# Patient Record
Sex: Male | Born: 1955 | Race: Black or African American | Hispanic: No | Marital: Married | Smoking: Never smoker
Health system: Southern US, Community
[De-identification: ages and names within clinical notes are randomized; demographics above are authoritative.]

## PROBLEM LIST (undated history)

## (undated) DIAGNOSIS — C61 Malignant neoplasm of prostate: Secondary | ICD-10-CM

## (undated) DIAGNOSIS — I10 Essential (primary) hypertension: Secondary | ICD-10-CM

## (undated) HISTORY — PX: XI ROBOTIC ASSISTED INGUINAL HERNIA REPAIR WITH MESH: SHX6706

## (undated) HISTORY — PX: WISDOM TOOTH EXTRACTION: SHX21

## (undated) HISTORY — PX: PROSTATE BIOPSY: SHX241

---

## 2012-04-01 HISTORY — PX: XI ROBOTIC ASSISTED INGUINAL HERNIA REPAIR WITH MESH: SHX6706

## 2019-04-02 DIAGNOSIS — C61 Malignant neoplasm of prostate: Secondary | ICD-10-CM

## 2019-04-02 HISTORY — DX: Malignant neoplasm of prostate: C61

## 2019-11-02 HISTORY — PX: PROSTATE BIOPSY: SHX241

## 2019-12-01 ENCOUNTER — Encounter: Payer: Self-pay | Admitting: Radiation Oncology

## 2019-12-01 NOTE — Progress Notes (Signed)
GU Location of Tumor / Histology: prostatic adenocarcinoma  If Prostate Cancer, Gleason Score is (3 + 3) and PSA is (19.60). Prostate volume: 119 grams.   Loma Sender presented as a referral from Dr. Kristie Cowman to Dr. Lovena Neighbours in June for further evaluation of an elevated PSA.   Biopsies of prostate (if applicable) revealed:   Past/Anticipated interventions by urology, if any: prostate biopsy, referral to Dr. Tammi Klippel to discuss radiotherapy options.  Past/Anticipated interventions by medical oncology, if any: no  Weight changes, if any: no  Bowel/Bladder complaints, if any: IPSS 5. SHIM 24. Endorses taking tamsulosin. Reports nocturia x 0-2. Reports occasional urgency and frequency. Denies dysuria or hematuria. Denies urinary leakage or incontinence. Denies any bowel complaints.     Nausea/Vomiting, if any: no  Pain issues, if any:  denies  SAFETY ISSUES:  Prior radiation? denies  Pacemaker/ICD? denies  Possible current pregnancy? no, male patient  Is the patient on methotrexate? denies  Current Complaints / other details:  64 year old male. Married with two daughters and one son. Resides in [place]. Not working currently. Denies a family history of cancer.  Leaning toward active surveillance thus Dr. Lovena Neighbours has scheduled the patient for an MRI fusion biopsy in six months.

## 2019-12-02 ENCOUNTER — Telehealth: Payer: Self-pay | Admitting: Radiation Oncology

## 2019-12-02 NOTE — Telephone Encounter (Signed)
Received voicemail message from patient requesting return call. Phoned patient to inquire. Patient questions if consult appointment tomorrow can be moved down to later in the day. Apologized and explained that Dr. Tammi Klippel is already scheduled to see other patients later in the day. Offered to connect patient to scheduling department to reschedule. Patient states, "I will just keep the current appointment as is." Patient denies additional needs.

## 2019-12-03 ENCOUNTER — Ambulatory Visit
Admission: RE | Admit: 2019-12-03 | Discharge: 2019-12-03 | Disposition: A | Discharge status: Home / Self Care | Payer: BC Managed Care – PPO | Source: Ambulatory Visit | Attending: Radiation Oncology | Admitting: Radiation Oncology

## 2019-12-03 ENCOUNTER — Encounter: Payer: Self-pay | Admitting: Radiation Oncology

## 2019-12-03 ENCOUNTER — Other Ambulatory Visit: Payer: Self-pay

## 2019-12-03 VITALS — BP 131/91 | HR 70 | Temp 98.3°F | Resp 20 | Ht 69.0 in | Wt 184.4 lb

## 2019-12-03 DIAGNOSIS — C61 Malignant neoplasm of prostate: Secondary | ICD-10-CM

## 2019-12-03 HISTORY — DX: Essential (primary) hypertension: I10

## 2019-12-03 HISTORY — DX: Malignant neoplasm of prostate: C61

## 2019-12-03 NOTE — Progress Notes (Signed)
Radiation Oncology         [phone] ________________________________  Initial outpatient Consultation  Name: Elijio Staples MRN: [id number]  Date: 12/03/2019  DOB: 06-29-1955  WU:JWJXB, Raynelle Dick, MD  Davis Gourd*   REFERRING PHYSICIAN: Davis Gourd*  DIAGNOSIS: 64 y.o. gentleman with Stage T1c adenocarcinoma of the prostate with Gleason score of 3+3, and PSA of 19.6.    ICD-10-CM   1. Malignant neoplasm of prostate ([place])  C61     HISTORY OF PRESENT ILLNESS: Gatsby Chismar is a 64 y.o. male with a diagnosis of prostate cancer. He was noted to have an elevated PSA of 16.16 by his primary care physician, Dr. Ronnald Ramp.  He was treated with a round of antibiotics in 07/2019.  Accordingly, he was referred for evaluation in urology by Dr. Lovena Neighbours on 09/27/2019,  digital rectal examination was performed at that time revealing no nodules.  Repeat PSA performed that day showed further elevation to 19.6.  The patient proceeded to transrectal ultrasound with 12 biopsies of the prostate on 11/02/2019.  The prostate volume measured 119 cc.  Out of 12 core biopsies, 1 was positive.  The maximum Gleason score was 3+3, and this was seen in 5% of right mid lateral.  The patient reviewed the biopsy results with his urologist and he has kindly been referred today for discussion of potential radiation treatment options.   PREVIOUS RADIATION THERAPY: No  PAST MEDICAL HISTORY:  Past Medical History:  Diagnosis Date  . Hypertension   . Prostate cancer ([place])       PAST SURGICAL HISTORY: Past Surgical History:  Procedure Laterality Date  . PROSTATE BIOPSY    . WISDOM TOOTH EXTRACTION    . XI ROBOTIC ASSISTED INGUINAL HERNIA REPAIR WITH MESH      FAMILY HISTORY:  Family History  Problem Relation Age of Onset  . Breast cancer Neg Hx   . Colon cancer Neg Hx   . Pancreatic cancer Neg Hx   . Prostate cancer Neg Hx     SOCIAL HISTORY:  Social History   Socioeconomic History  . Marital  status: Married    Spouse name: Not on file  . Number of children: 3  . Years of education: Not on file  . Highest education level: Not on file  Occupational History    Comment: not working currently  Tobacco Use  . Smoking status: Never Smoker  . Smokeless tobacco: Never Used  Vaping Use  . Vaping Use: Never used  Substance and Sexual Activity  . Alcohol use: Yes    Comment: on occasion   . Drug use: Never  . Sexual activity: Yes  Other Topics Concern  . Not on file  Social History Narrative   Two girls and one boy.    Social Determinants of Health   Financial Resource Strain:   . Difficulty of Paying Living Expenses: Not on file  Food Insecurity:   . Worried About Charity fundraiser in the Last Year: Not on file  . Ran Out of Food in the Last Year: Not on file  Transportation Needs:   . Lack of Transportation (Medical): Not on file  . Lack of Transportation (Non-Medical): Not on file  Physical Activity:   . Days of Exercise per Week: Not on file  . Minutes of Exercise per Session: Not on file  Stress:   . Feeling of Stress : Not on file  Social Connections:   . Frequency of Communication with Friends and  Family: Not on file  . Frequency of Social Gatherings with Friends and Family: Not on file  . Attends Religious Services: Not on file  . Active Member of Clubs or Organizations: Not on file  . Attends Archivist Meetings: Not on file  . Marital Status: Not on file  Intimate Partner Violence:   . Fear of Current or Ex-Partner: Not on file  . Emotionally Abused: Not on file  . Physically Abused: Not on file  . Sexually Abused: Not on file    ALLERGIES: Patient has no known allergies.  MEDICATIONS:  Current Outpatient Medications  Medication Sig Dispense Refill  . amLODipine (NORVASC) 5 MG tablet Take 5 mg by mouth daily.    Marland Kitchen amoxicillin (AMOXIL) 500 MG capsule Take 500 mg by mouth 3 (three) times daily.    . tamsulosin (FLOMAX) 0.4 MG CAPS capsule  Take 0.4 mg by mouth daily.    . Vitamin D, Ergocalciferol, (DRISDOL) 1.25 MG (50000 UNIT) CAPS capsule Take by mouth.     No current facility-administered medications for this encounter.    REVIEW OF SYSTEMS:  On review of systems, the patient reports that he is doing well overall. He denies any chest pain, shortness of breath, cough, fevers, chills, night sweats, unintended weight changes. He denies any bowel disturbances, and denies abdominal pain, nausea or vomiting. He denies any new musculoskeletal or joint aches or pains. His IPSS was 5. His SHIM was 24. A complete review of systems is obtained and is otherwise negative.    PHYSICAL EXAM:  Wt Readings from Last 3 Encounters:  12/03/19 184 lb 6.4 oz (83.6 kg)   Temp Readings from Last 3 Encounters:  12/03/19 98.3 F (36.8 C)   BP Readings from Last 3 Encounters:  12/03/19 (!) 131/91   Pulse Readings from Last 3 Encounters:  12/03/19 70   Pain Assessment Pain Score: 0-No pain/10  In general this is a well appearing black man in no acute distress. He is alert and oriented x4 and appropriate throughout the examination. HEENT reveals that the patient is normocephalic, atraumatic. EOMs are intact. PERRLA. Skin is intact without any evidence of gross lesions.  KPS = 100  100 - Normal; no complaints; no evidence of disease. 90   - Able to carry on normal activity; minor signs or symptoms of disease. 80   - Normal activity with effort; some signs or symptoms of disease. 59   - Cares for self; unable to carry on normal activity or to do active work. 60   - Requires occasional assistance, but is able to care for most of his personal needs. 50   - Requires considerable assistance and frequent medical care. 22   - Disabled; requires special care and assistance. 3   - Severely disabled; hospital admission is indicated although death not imminent. 49   - Very sick; hospital admission necessary; active supportive treatment  necessary. 10   - Moribund; fatal processes progressing rapidly. 0     - Dead  Karnofsky DA, Abelmann WH, Craver LS and Burchenal JH [phone]) The use of the nitrogen mustards in the palliative treatment of carcinoma: with particular reference to bronchogenic carcinoma Cancer 1 [phone]  LABORATORY DATA:  No results found for: WBC, HGB, HCT, MCV, PLT No results found for: NA, K, CL, CO2 No results found for: ALT, AST, GGT, ALKPHOS, BILITOT   RADIOGRAPHY: No results found.    IMPRESSION/PLAN: 1. 64 y.o. gentleman with Stage T1c adenocarcinoma of the  prostate with Gleason Score of 3+3, and PSA of 19.6. We discussed the patient's workup and outlined the nature of prostate cancer in this setting. The patient's PSA puts him into the intermediate risk group, although a component of the high PSA may be related to his markedly enlarged gland. He is eligible for a variety of potential treatment options including 5.5 weeks of external radiation or prostatectomy. We discussed the available radiation techniques, and focused on the details and logistics of delivery. The patient is not an ideal candidate for brachytherapy with a prostate volume of 119 cc. We discussed and outlined the risks, benefits, short and long-term effects associated with radiotherapy and compared and contrasted these with prostatectomy. We discussed the role of SpaceOAR gel in reducing the rectal toxicity associated with radiotherapy. He appears to have a good understanding of his disease and our treatment recommendations which are of curative intent.  He was encouraged to ask questions that were answered to his stated satisfaction.  At the conclusion of our conversation, the patient is interested in moving forward with active surveillance to include prostate MRI.  I think this may be reasonable, given the low volume of disease in terms of percentage core biopsies positive.  MRI can help potentially identify any abnormalities in the anterior  gland, for directed biopsies, if indicated.  We spent 60 minutes gathering information from the referring physician notes, pathology report, face to face with the patient and more than 50% of that time was spent in counseling and/or coordination of care, and after the visit completing documentation and correspondence.    Nicholos Johns, PA-C    Tyler Pita, MD  [facility] Direct Dial: [phone]  Fax: [phone] [email or web address]  Skype  [email or web address]   This document serves as a record of services personally performed by Tyler Pita, MD and Freeman Caldron, PA-C. It was created on their behalf by Wilburn Mylar, a trained medical scribe. The creation of this record is based on the scribe's personal observations and the provider's statements to them. This document has been checked and approved by the attending provider.

## 2019-12-07 ENCOUNTER — Encounter: Payer: Self-pay | Admitting: Medical Oncology

## 2019-12-07 DIAGNOSIS — C61 Malignant neoplasm of prostate: Secondary | ICD-10-CM | POA: Insufficient documentation

## 2019-12-07 NOTE — Progress Notes (Signed)
After discussion with Dr. Tammi Klippel and Ailene Ards, PA, he would like to continue active surveillance at this time.

## 2020-05-04 ENCOUNTER — Other Ambulatory Visit: Payer: Self-pay | Admitting: Urology

## 2020-05-04 DIAGNOSIS — C61 Malignant neoplasm of prostate: Secondary | ICD-10-CM

## 2020-05-11 ENCOUNTER — Ambulatory Visit (HOSPITAL_COMMUNITY): Payer: BC Managed Care – PPO

## 2020-05-11 ENCOUNTER — Encounter (HOSPITAL_COMMUNITY): Payer: Self-pay

## 2020-08-27 ENCOUNTER — Encounter (HOSPITAL_BASED_OUTPATIENT_CLINIC_OR_DEPARTMENT_OTHER): Payer: Self-pay

## 2020-08-27 ENCOUNTER — Emergency Department (HOSPITAL_BASED_OUTPATIENT_CLINIC_OR_DEPARTMENT_OTHER): Payer: BC Managed Care – PPO

## 2020-08-27 ENCOUNTER — Inpatient Hospital Stay (HOSPITAL_BASED_OUTPATIENT_CLINIC_OR_DEPARTMENT_OTHER)
Admission: EM | Admit: 2020-08-27 | Discharge: 2020-08-30 | DRG: 683 | Disposition: A | Discharge status: Home / Self Care | Payer: BC Managed Care – PPO | Attending: Internal Medicine | Admitting: Internal Medicine

## 2020-08-27 ENCOUNTER — Other Ambulatory Visit: Payer: Self-pay

## 2020-08-27 DIAGNOSIS — N401 Enlarged prostate with lower urinary tract symptoms: Secondary | ICD-10-CM | POA: Diagnosis present

## 2020-08-27 DIAGNOSIS — R8271 Bacteriuria: Secondary | ICD-10-CM | POA: Diagnosis present

## 2020-08-27 DIAGNOSIS — N179 Acute kidney failure, unspecified: Secondary | ICD-10-CM | POA: Diagnosis not present

## 2020-08-27 DIAGNOSIS — E876 Hypokalemia: Secondary | ICD-10-CM | POA: Diagnosis present

## 2020-08-27 DIAGNOSIS — Z79899 Other long term (current) drug therapy: Secondary | ICD-10-CM

## 2020-08-27 DIAGNOSIS — Z8546 Personal history of malignant neoplasm of prostate: Secondary | ICD-10-CM

## 2020-08-27 DIAGNOSIS — Z20822 Contact with and (suspected) exposure to covid-19: Secondary | ICD-10-CM | POA: Diagnosis not present

## 2020-08-27 DIAGNOSIS — N138 Other obstructive and reflux uropathy: Secondary | ICD-10-CM | POA: Diagnosis not present

## 2020-08-27 DIAGNOSIS — A059 Bacterial foodborne intoxication, unspecified: Secondary | ICD-10-CM | POA: Diagnosis present

## 2020-08-27 DIAGNOSIS — R112 Nausea with vomiting, unspecified: Secondary | ICD-10-CM | POA: Diagnosis present

## 2020-08-27 DIAGNOSIS — R338 Other retention of urine: Secondary | ICD-10-CM | POA: Diagnosis not present

## 2020-08-27 DIAGNOSIS — E860 Dehydration: Secondary | ICD-10-CM | POA: Diagnosis present

## 2020-08-27 DIAGNOSIS — I10 Essential (primary) hypertension: Secondary | ICD-10-CM | POA: Diagnosis present

## 2020-08-27 DIAGNOSIS — R1030 Lower abdominal pain, unspecified: Secondary | ICD-10-CM | POA: Diagnosis present

## 2020-08-27 DIAGNOSIS — R339 Retention of urine, unspecified: Secondary | ICD-10-CM

## 2020-08-27 DIAGNOSIS — K529 Noninfective gastroenteritis and colitis, unspecified: Secondary | ICD-10-CM

## 2020-08-27 LAB — CBC WITH DIFFERENTIAL/PLATELET
Abs Immature Granulocytes: 0.05 10*3/uL (ref 0.00–0.07)
Basophils Absolute: 0 10*3/uL (ref 0.0–0.1)
Basophils Relative: 0 %
Eosinophils Absolute: 0 10*3/uL (ref 0.0–0.5)
Eosinophils Relative: 0 %
HCT: 39.4 % (ref 39.0–52.0)
Hemoglobin: 12.6 g/dL — ABNORMAL LOW (ref 13.0–17.0)
Immature Granulocytes: 0 %
Lymphocytes Relative: 8 %
Lymphs Abs: 1.1 10*3/uL (ref 0.7–4.0)
MCH: 22.3 pg — ABNORMAL LOW (ref 26.0–34.0)
MCHC: 32 g/dL (ref 30.0–36.0)
MCV: 69.7 fL — ABNORMAL LOW (ref 80.0–100.0)
Monocytes Absolute: 1 10*3/uL (ref 0.1–1.0)
Monocytes Relative: 7 %
Neutro Abs: 12.7 10*3/uL — ABNORMAL HIGH (ref 1.7–7.7)
Neutrophils Relative %: 85 %
Platelets: 257 10*3/uL (ref 150–400)
RBC: 5.65 MIL/uL (ref 4.22–5.81)
RDW: 17.6 % — ABNORMAL HIGH (ref 11.5–15.5)
WBC: 14.9 10*3/uL — ABNORMAL HIGH (ref 4.0–10.5)
nRBC: 0 % (ref 0.0–0.2)

## 2020-08-27 LAB — COMPREHENSIVE METABOLIC PANEL
ALT: 39 U/L (ref 0–44)
AST: 44 U/L — ABNORMAL HIGH (ref 15–41)
Albumin: 3.8 g/dL (ref 3.5–5.0)
Alkaline Phosphatase: 59 U/L (ref 38–126)
Anion gap: 11 (ref 5–15)
BUN: 20 mg/dL (ref 8–23)
CO2: 19 mmol/L — ABNORMAL LOW (ref 22–32)
Calcium: 8.6 mg/dL — ABNORMAL LOW (ref 8.9–10.3)
Chloride: 103 mmol/L (ref 98–111)
Creatinine, Ser: 3.41 mg/dL — ABNORMAL HIGH (ref 0.61–1.24)
GFR, Estimated: 19 mL/min — ABNORMAL LOW (ref >60)
Glucose, Bld: 120 mg/dL — ABNORMAL HIGH (ref 70–99)
Potassium: 3.1 mmol/L — ABNORMAL LOW (ref 3.5–5.1)
Sodium: 133 mmol/L — ABNORMAL LOW (ref 135–145)
Total Bilirubin: 1.4 mg/dL — ABNORMAL HIGH (ref 0.3–1.2)
Total Protein: 6.9 g/dL (ref 6.5–8.1)

## 2020-08-27 LAB — RESP PANEL BY RT-PCR (FLU A&B, COVID) ARPGX2
Influenza A by PCR: NEGATIVE
Influenza B by PCR: NEGATIVE
SARS Coronavirus 2 by RT PCR: NEGATIVE

## 2020-08-27 LAB — URINALYSIS, ROUTINE W REFLEX MICROSCOPIC
Bilirubin Urine: NEGATIVE
Glucose, UA: NEGATIVE mg/dL
Ketones, ur: 15 mg/dL — AB
Leukocytes,Ua: NEGATIVE
Nitrite: NEGATIVE
Protein, ur: NEGATIVE mg/dL
Specific Gravity, Urine: <1.005 — ABNORMAL LOW (ref 1.005–1.030)
pH: 6 (ref 5.0–8.0)

## 2020-08-27 LAB — URINALYSIS, MICROSCOPIC (REFLEX)

## 2020-08-27 LAB — LIPASE, BLOOD: Lipase: 29 U/L (ref 11–51)

## 2020-08-27 LAB — MAGNESIUM: Magnesium: 2.1 mg/dL (ref 1.7–2.4)

## 2020-08-27 MED ORDER — ONDANSETRON HCL 4 MG/2ML IJ SOLN
4.0000 mg | Freq: Once | INTRAMUSCULAR | Status: AC
  Administered 2020-08-27: 4 mg via INTRAVENOUS
  Filled 2020-08-27: qty 2

## 2020-08-27 MED ORDER — SODIUM CHLORIDE 0.9 % IV BOLUS
1000.0000 mL | Freq: Once | INTRAVENOUS | Status: AC
  Administered 2020-08-27: 1000 mL via INTRAVENOUS

## 2020-08-27 MED ORDER — SODIUM CHLORIDE 0.9 % IV SOLN: Freq: Once | INTRAVENOUS | Status: AC

## 2020-08-27 MED ORDER — POTASSIUM CHLORIDE 10 MEQ/100ML IV SOLN
10.0000 meq | Freq: Once | INTRAVENOUS | Status: AC
  Administered 2020-08-27: 10 meq via INTRAVENOUS
  Filled 2020-08-27: qty 100

## 2020-08-27 NOTE — ED Provider Notes (Addendum)
[facility] Provider Note   CSN: [id number] Arrival date & time: 08/27/20  1806     History Chief Complaint  Patient presents with  . Abdominal Pain    Patrick Everett is a 65 y.o. male.  The history is provided by the patient.  Diarrhea Quality:  Watery Severity:  Mild Onset quality:  Gradual Timing:  Intermittent Progression:  Unchanged Relieved by:  Nothing Worsened by:  Nothing Associated symptoms: abdominal pain and vomiting   Associated symptoms: no arthralgias, no chills and no fever   Risk factors: suspect food intake        Past Medical History:  Diagnosis Date  . Hypertension   . Prostate cancer [facility])     Patient Active Problem List   Diagnosis Date Noted  . Malignant neoplasm of prostate ([place]) 12/07/2019    Past Surgical History:  Procedure Laterality Date  . PROSTATE BIOPSY    . WISDOM TOOTH EXTRACTION    . XI ROBOTIC ASSISTED INGUINAL HERNIA REPAIR WITH MESH         Family History  Problem Relation Age of Onset  . Breast cancer Neg Hx   . Colon cancer Neg Hx   . Pancreatic cancer Neg Hx   . Prostate cancer Neg Hx     Social History   Tobacco Use  . Smoking status: Never Smoker  . Smokeless tobacco: Never Used  Vaping Use  . Vaping Use: Never used  Substance Use Topics  . Alcohol use: Yes    Comment: on occasion   . Drug use: Never    Home Medications Prior to Admission medications   Medication Sig Start Date End Date Taking? Authorizing Provider  amLODipine (NORVASC) 5 MG tablet Take 5 mg by mouth daily. 08/11/19   [provider]  amoxicillin (AMOXIL) 500 MG capsule Take 500 mg by mouth 3 (three) times daily. 12/01/19   [provider]  tamsulosin (FLOMAX) 0.4 MG CAPS capsule Take 0.4 mg by mouth daily. 10/25/19   [provider]  Vitamin D, Ergocalciferol, (DRISDOL) 1.25 MG (50000 UNIT) CAPS capsule Take by mouth. 10/29/19   [provider]    Allergies    Patient  has no known allergies.  Review of Systems   Review of Systems  Constitutional: Negative for chills and fever.  HENT: Negative for ear pain and sore throat.   Eyes: Negative for pain and visual disturbance.  Respiratory: Negative for cough and shortness of breath.   Cardiovascular: Negative for chest pain and palpitations.  Gastrointestinal: Positive for abdominal pain, diarrhea, nausea and vomiting.  Genitourinary: Negative for dysuria and hematuria.  Musculoskeletal: Negative for arthralgias and back pain.  Skin: Negative for color change and rash.  Neurological: Negative for seizures and syncope.  All other systems reviewed and are negative.   Physical Exam Updated Vital Signs BP (!) 159/97   Pulse 76   Temp 98.4 F (36.9 C) (Oral)   Resp 19   SpO2 97%   Physical Exam Vitals and nursing note reviewed.  Constitutional:      General: He is not in acute distress.    Appearance: He is well-developed. He is not ill-appearing.  HENT:     Head: Normocephalic and atraumatic.     Mouth/Throat:     Mouth: Mucous membranes are moist.  Eyes:     Extraocular Movements: Extraocular movements intact.     Conjunctiva/sclera: Conjunctivae normal.     Pupils: Pupils are equal, round, and reactive  to light.  Cardiovascular:     Rate and Rhythm: Normal rate and regular rhythm.     Heart sounds: Normal heart sounds. No murmur heard.   Pulmonary:     Effort: Pulmonary effort is normal. No respiratory distress.     Breath sounds: Normal breath sounds.  Abdominal:     General: There is no distension.     Palpations: Abdomen is soft.     Tenderness: There is generalized abdominal tenderness. There is no guarding or rebound.  Musculoskeletal:     Cervical back: Neck supple.  Skin:    General: Skin is warm and dry.     Capillary Refill: Capillary refill takes less than 2 seconds.  Neurological:     General: No focal deficit present.     Mental Status: He is alert.     ED Results  / Procedures / Treatments   Labs (all labs ordered are listed, but only abnormal results are displayed) Labs Reviewed  CBC WITH DIFFERENTIAL/PLATELET - Abnormal; Notable for the following components:      Result Value   WBC 14.9 (*)    Hemoglobin 12.6 (*)    MCV 69.7 (*)    MCH 22.3 (*)    RDW 17.6 (*)    Neutro Abs 12.7 (*)    All other components within normal limits  COMPREHENSIVE METABOLIC PANEL - Abnormal; Notable for the following components:   Sodium 133 (*)    Potassium 3.1 (*)    CO2 19 (*)    Glucose, Bld 120 (*)    Creatinine, Ser 3.41 (*)    Calcium 8.6 (*)    AST 44 (*)    Total Bilirubin 1.4 (*)    GFR, Estimated 19 (*)    All other components within normal limits  GASTROINTESTINAL PANEL BY PCR, STOOL (REPLACES STOOL CULTURE)  RESP PANEL BY RT-PCR (FLU A&B, COVID) ARPGX2  C DIFFICILE QUICK SCREEN W PCR REFLEX  LIPASE, BLOOD  MAGNESIUM  URINALYSIS, ROUTINE W REFLEX MICROSCOPIC    EKG None  Radiology CT ABDOMEN PELVIS WO CONTRAST  Result Date: 08/27/2020 CLINICAL DATA:  Diarrhea for 2 days, nausea and vomiting, emesis, pain EXAM: CT ABDOMEN AND PELVIS WITHOUT CONTRAST TECHNIQUE: Multidetector CT imaging of the abdomen and pelvis was performed following the standard protocol without IV contrast. COMPARISON:  None. FINDINGS: Lower chest: No acute pleural or parenchymal lung disease. Hepatobiliary: Unenhanced imaging of the liver demonstrates no gross abnormalities. No biliary duct dilation. The gallbladder is unremarkable. Pancreas: Unremarkable. No pancreatic ductal dilatation or surrounding inflammatory changes. Spleen: Normal in size without focal abnormality. Adrenals/Urinary Tract: No evidence of urinary tract calculi. There is mild distension of the bilateral renal collecting systems and ureters, likely due to significant bladder distension. Bladder distension is likely due to chronic bladder outlet obstruction given a markedly enlarged and heterogeneous  prostate. Significant bilateral perirenal fat stranding also felt to be the sequela of chronic bladder outlet obstruction. The adrenals are grossly normal. Stomach/Bowel: No bowel obstruction or ileus. Normal appendix right lower quadrant. No bowel wall thickening or inflammatory change. Vascular/Lymphatic: No significant vascular findings on this unenhanced exam. No pathologic adenopathy within the abdomen or pelvis. Reproductive: The prostate is heterogeneous and enlarged, measuring approximately 6.3 x 7.4 x 6.3 cm. Other: There is trace free fluid within the pelvis. No free intraperitoneal gas. No abdominal wall hernia. Musculoskeletal: No acute or destructive bony lesions. Reconstructed images demonstrate no additional findings. IMPRESSION: 1. Markedly enlarged prostate, with evidence of chronic  bladder outlet obstruction. 2. Trace pelvic free fluid, nonspecific. 3. No evidence of bowel obstruction or ileus. No CT findings to explain the patient's diarrhea and nausea/vomiting. Electronically Signed   By: Randa Ngo M.D.   On: 08/27/2020 20:30    Procedures .Critical Care Performed by: Lennice Sites, DO Authorized by: Lennice Sites, DO   Critical care provider statement:    Critical care time (minutes):  35   Critical care was necessary to treat or prevent imminent or life-threatening deterioration of the following conditions:  Dehydration and renal failure   Critical care was time spent personally by me on the following activities:  Blood draw for specimens, development of treatment plan with patient or surrogate, discussions with primary provider, evaluation of patient's response to treatment, examination of patient, obtaining history from patient or surrogate, ordering and performing treatments and interventions, ordering and review of laboratory studies, ordering and review of radiographic studies, pulse oximetry, re-evaluation of patient's condition and review of old charts   I assumed  direction of critical care for this patient from another provider in my specialty: no       Medications Ordered in ED Medications  potassium chloride 10 mEq in 100 mL IVPB (10 mEq Intravenous New Bag/Given 08/27/20 2032)  sodium chloride 0.9 % bolus 1,000 mL ( Intravenous Stopped 08/27/20 1951)  ondansetron (ZOFRAN) injection 4 mg (4 mg Intravenous Given 08/27/20 1850)  sodium chloride 0.9 % bolus 1,000 mL (1,000 mLs Intravenous New Bag/Given 08/27/20 2031)    ED Course  I have reviewed the triage vital signs and the nursing notes.  Pertinent labs & imaging results that were available during my care of the patient were reviewed by me and considered in my medical decision making (see chart for details).    MDM Rules/Calculators/A&P                          Patrick Everett is here with nausea, vomiting, diarrhea after eating suspected bad chicken.  Overall normal vitals.  No fever.  Multiple episodes of nausea, vomiting, diarrhea since yesterday.  They have for the most part slowed down but he feels dehydrated.  Having some intermittent abdominal cramping.  Overall suspect gastroenteritis secondary to food or viral process.  Has no real focal abdominal tenderness on exam.  Have low suspicion for appendicitis or perforated bowel.  We will try to send off a stool sample if he can provide 1 to evaluate for Salmonella.  We will give him fluid bolus, Zofran and check basic labs to look for electrolyte issues and AKI.  Will reevaluate but do not think he needs any abdominal imaging at this time and will focus on supportive care.  Patient with white count of 14.9.  Creatinine of 3.41.  Potassium 3.1.  Still fairly tender in the abdomen.  States that he was able to make a little bit a urine but did not have a urine cup when he went.  CT scan showed enlarged prostate with evidence of chronic bladder outlet obstruction.  Otherwise unremarkable imaging.  Bladder scan showed around 500 cc of urine.  Suspect AKI  could be a combination of dehydration and from acute urinary retention.  Do not have any prior creatinines to compare to.  Will admit for further hydration and care.  Will give second fluid bolus and start maintenance fluids. 2L drained from foley. Will add abx if urine infected.  This chart was dictated using voice recognition software.  Despite best efforts to proofread,  errors can occur which can change the documentation meaning.    Final Clinical Impression(s) / ED Diagnoses Final diagnoses:  AKI (acute kidney injury) [facility])  Gastroenteritis  Urinary retention    Rx / DC Orders ED Discharge Orders    None       Lennice Sites, DO 08/27/20 2047    Lennice Sites, DO 08/27/20 2047    Lennice Sites, DO 08/27/20 2157

## 2020-08-27 NOTE — H&P (Addendum)
Patrick Everett BWG:[id number] DOB: 04-May-1955 DOA: 08/27/2020     PCP: Kristie Cowman, MD   Outpatient Specialists: [facility]   Urology Dr. Royce Macadamia  Patient arrived to ER on 08/27/20 at 1806 Referred by Attending Rise Patience, MD   Patient coming from: home Lives   With family    Chief Complaint:   Chief Complaint  Patient presents with  . Abdominal Pain    HPI: Patrick Everett is a 65 y.o. male with medical history significant of  Prostate Ca, hypertension    Presented with nausea vomiting and diarrhea that started last morning on 28 May.  Today he had up to 3 times emesis.  He continued to have significant abdominal pain. Patient attributed this to bad chicken that he ate. He took chicken to work and did not put it the refrigerate it Diarrhea been watery. Reports significant lower abd cramping He has not taken flomax today Have not been eating He has been drinking green tea and water  Got better after foley No fevers or chills No sick contacts     Has     been vaccinated against COVID   and boosted   Initial COVID TEST  NEGATIVE   Lab Results  Component Value Date   [place] NEGATIVE 08/27/2020     Regarding pertinent Chronic problems:     HTN on Norvasc  BPH - on Flomax,        While in ER: Low blood cell count up to 14.9 k 3.1 and Cr 3.41 Emergency department potassium was given patient was given IV fluids CT scan showed enlarged prostate with evidence of chronic bladder outlet obstruction Bladder scan was 500 cc of urine Foley placed ED Triage Vitals  Enc Vitals Group     BP 08/27/20 1813 (!) 155/107     Pulse Rate 08/27/20 1813 84     Resp 08/27/20 1934 18     Temp 08/27/20 1813 98.4 F (36.9 C)     Temp Source 08/27/20 1813 Oral     SpO2 08/27/20 1813 100 %     Weight --      Height --      Head Circumference --      Peak Flow --      Pain Score 08/27/20 1814 8     Pain Loc --      Pain Edu? --      Excl. in [place]? --   TMAX(24)@      _________________________________________ Significant initial  Findings: Abnormal Labs Reviewed  CBC WITH DIFFERENTIAL/PLATELET - Abnormal; Notable for the following components:      Result Value   WBC 14.9 (*)    Hemoglobin 12.6 (*)    MCV 69.7 (*)    MCH 22.3 (*)    RDW 17.6 (*)    Neutro Abs 12.7 (*)    All other components within normal limits  COMPREHENSIVE METABOLIC PANEL - Abnormal; Notable for the following components:   Sodium 133 (*)    Potassium 3.1 (*)    CO2 19 (*)    Glucose, Bld 120 (*)    Creatinine, Ser 3.41 (*)    Calcium 8.6 (*)    AST 44 (*)    Total Bilirubin 1.4 (*)    GFR, Estimated 19 (*)    All other components within normal limits  URINALYSIS, ROUTINE W REFLEX MICROSCOPIC - Abnormal; Notable for the following components:   Specific Gravity, Urine <1.005 (*)    Hgb urine  dipstick LARGE (*)    Ketones, ur 15 (*)    All other components within normal limits  URINALYSIS, MICROSCOPIC (REFLEX) - Abnormal; Notable for the following components:   Bacteria, UA MANY (*)    All other components within normal limits   ____________________________________________ Ordered   CTabd/pelvis -  Markedly enlarged prostate, with evidence of chronic bladder outlet obstruction   ECG: Ordered  HR 54 QTC 407 Sinus bradycardia, nn ischemic __ _________ The recent clinical data is shown below. Vitals:   08/27/20 1830 08/27/20 1934 08/27/20 2030 08/27/20 2355  BP: (!) 158/100 (!) 158/100 (!) 159/97 (!) 136/91  Pulse: 66 66 76 (!) 56  Resp:  18 19 20   Temp:    99.2 F (37.3 C)  TempSrc:    Oral  SpO2: 100% 100% 97% 100%     WBC     Component Value Date/Time   WBC 14.9 (H) 08/27/2020 1851   LYMPHSABS 1.1 08/27/2020 1851   MONOABS 1.0 08/27/2020 1851   EOSABS 0.0 08/27/2020 1851   BASOSABS 0.0 08/27/2020 1851      UA WBC RBC and bacteria   Urine analysis:    Component Value Date/Time   COLORURINE YELLOW 08/27/2020 2130   APPEARANCEUR CLEAR 08/27/2020  2130   LABSPEC <1.005 (L) 08/27/2020 2130   PHURINE 6.0 08/27/2020 2130   GLUCOSEU NEGATIVE 08/27/2020 2130   HGBUR LARGE (A) 08/27/2020 2130   BILIRUBINUR NEGATIVE 08/27/2020 2130   KETONESUR 15 (A) 08/27/2020 2130   PROTEINUR NEGATIVE 08/27/2020 2130   NITRITE NEGATIVE 08/27/2020 2130   LEUKOCYTESUR NEGATIVE 08/27/2020 2130    Results for orders placed or performed during the hospital encounter of 08/27/20  Resp Panel by RT-PCR (Flu A&B, Covid) Nasopharyngeal Swab     Status: None   Collection Time: 08/27/20  8:33 PM   Specimen: Nasopharyngeal Swab; Nasopharyngeal(NP) swabs in vial transport medium  Result Value Ref Range Status   SARS Coronavirus 2 by RT PCR NEGATIVE NEGATIVE Final    Comment: (NOTE) SARS-CoV-2 target nucleic acids are NOT DETECTED.  The SARS-CoV-2 RNA is generally detectable in upper respiratory specimens during the acute phase of infection. The lowest concentration of SARS-CoV-2 viral copies this assay can detect is 138 copies/mL. A negative result does not preclude SARS-Cov-2 infection and should not be used as the sole basis for treatment or other patient management decisions. A negative result may occur with  improper specimen collection/handling, submission of specimen other than nasopharyngeal swab, presence of viral mutation(s) within the areas targeted by this assay, and inadequate number of viral copies(<138 copies/mL). A negative result must be combined with clinical observations, patient history, and epidemiological information. The expected result is Negative.  Fact Sheet for Patients:  EntrepreneurPulse.com.au  Fact Sheet for Healthcare Providers:  IncredibleEmployment.be  This test is no t yet approved or cleared by the [place] FDA and  has been authorized for detection and/or diagnosis of SARS-CoV-2 by FDA under an Emergency Use Authorization (EUA). This EUA will remain  in effect (meaning this  test can be used) for the duration of the COVID-19 declaration under Section 564(b)(1) of the Act, 21 U.S.C.section 360bbb-3(b)(1), unless the authorization is terminated  or revoked sooner.       Influenza A by PCR NEGATIVE NEGATIVE Final   Influenza B by PCR NEGATIVE NEGATIVE Final    Comment: (NOTE) The Xpert Xpress SARS-CoV-2/FLU/RSV plus assay is intended as an aid in the diagnosis of influenza from Nasopharyngeal swab specimens and should not  be used as a sole basis for treatment. Nasal washings and aspirates are unacceptable for Xpert Xpress SARS-CoV-2/FLU/RSV testing.  Fact Sheet for Patients: EntrepreneurPulse.com.au  Fact Sheet for Healthcare Providers: IncredibleEmployment.be  This test is not yet approved or cleared by the [place] FDA and has been authorized for detection and/or diagnosis of SARS-CoV-2 by FDA under an Emergency Use Authorization (EUA). This EUA will remain in effect (meaning this test can be used) for the duration of the COVID-19 declaration under Section 564(b)(1) of the Act, 21 U.S.C. section 360bbb-3(b)(1), unless the authorization is terminated or revoked.  Performed at [facility], [address] [place], [place] [address]     _______________________________________________ Hospitalist was called for admission for AKI  The following Work up has been ordered so far:  Orders Placed This Encounter  Procedures  . Critical Care  . Gastrointestinal Panel by PCR , Stool  . Resp Panel by RT-PCR (Flu A&B, Covid) Nasopharyngeal Swab  . C Difficile Quick Screen w PCR reflex  . CT ABDOMEN PELVIS WO CONTRAST  . CBC with Differential  . Comprehensive metabolic panel  . Lipase, blood  . Urinalysis, Routine w reflex microscopic  . Magnesium  . Urinalysis, Microscopic (reflex)  . Insert foley catheter  . Consult to hospitalist  . Enteric precautions (UV disinfection)  . Place in observation  (patient's expected length of stay will be less than 2 midnights)     Following Medications were ordered in ER: Medications  sodium chloride 0.9 % bolus 1,000 mL ( Intravenous Stopped 08/27/20 1951)  ondansetron (ZOFRAN) injection 4 mg (4 mg Intravenous Given 08/27/20 1850)  sodium chloride 0.9 % bolus 1,000 mL ( Intravenous Stopped 08/27/20 2135)  potassium chloride 10 mEq in 100 mL IVPB (0 mEq Intravenous Stopped 08/27/20 2134)  0.9 %  sodium chloride infusion ( Intravenous New Bag/Given 08/27/20 2138)        Consult Orders  (From admission, onward)         Start     Ordered   08/27/20 2034  Consult to hospitalist  Once       Provider:  (Not yet assigned)  Question Answer Comment  Place call to: [facility]   Reason for Consult Admit      08/27/20 2033             OTHER Significant initial  Findings:  labs showing:    Recent Labs  Lab 08/27/20 1851  NA 133*  K 3.1*  CO2 19*  GLUCOSE 120*  BUN 20  CREATININE 3.41*  CALCIUM 8.6*  MG 2.1    Cr    Lab Results  Component Value Date   CREATININE 3.41 (H) 08/27/2020    Recent Labs  Lab 08/27/20 1851  AST 44*  ALT 39  ALKPHOS 59  BILITOT 1.4*  PROT 6.9  ALBUMIN 3.8   Lab Results  Component Value Date   CALCIUM 8.6 (L) 08/27/2020          Plt: Lab Results  Component Value Date   PLT 257 08/27/2020        Recent Labs  Lab 08/27/20 1851  WBC 14.9*  NEUTROABS 12.7*  HGB 12.6*  HCT 39.4  MCV 69.7*  PLT 257    HG/HCT stable,      Component Value Date/Time   HGB 12.6 (L) 08/27/2020 1851   HCT 39.4 08/27/2020 1851   MCV 69.7 (L) 08/27/2020 1851      Recent Labs  Lab  08/27/20 1851  LIPASE 29   No results for input(s): AMMONIA in the last 168 hours.   Cardiac Panel (last 3 results) No results for input(s): CKTOTAL, CKMB, TROPONINI, RELINDX in the last 72 hours.       Cultures: No results found for: SDES, [place], CULT, REPTSTATUS   Radiological Exams on  Admission: CT ABDOMEN PELVIS WO CONTRAST  Result Date: 08/27/2020 CLINICAL DATA:  Diarrhea for 2 days, nausea and vomiting, emesis, pain EXAM: CT ABDOMEN AND PELVIS WITHOUT CONTRAST TECHNIQUE: Multidetector CT imaging of the abdomen and pelvis was performed following the standard protocol without IV contrast. COMPARISON:  None. FINDINGS: Lower chest: No acute pleural or parenchymal lung disease. Hepatobiliary: Unenhanced imaging of the liver demonstrates no gross abnormalities. No biliary duct dilation. The gallbladder is unremarkable. Pancreas: Unremarkable. No pancreatic ductal dilatation or surrounding inflammatory changes. Spleen: Normal in size without focal abnormality. Adrenals/Urinary Tract: No evidence of urinary tract calculi. There is mild distension of the bilateral renal collecting systems and ureters, likely due to significant bladder distension. Bladder distension is likely due to chronic bladder outlet obstruction given a markedly enlarged and heterogeneous prostate. Significant bilateral perirenal fat stranding also felt to be the sequela of chronic bladder outlet obstruction. The adrenals are grossly normal. Stomach/Bowel: No bowel obstruction or ileus. Normal appendix right lower quadrant. No bowel wall thickening or inflammatory change. Vascular/Lymphatic: No significant vascular findings on this unenhanced exam. No pathologic adenopathy within the abdomen or pelvis. Reproductive: The prostate is heterogeneous and enlarged, measuring approximately 6.3 x 7.4 x 6.3 cm. Other: There is trace free fluid within the pelvis. No free intraperitoneal gas. No abdominal wall hernia. Musculoskeletal: No acute or destructive bony lesions. Reconstructed images demonstrate no additional findings. IMPRESSION: 1. Markedly enlarged prostate, with evidence of chronic bladder outlet obstruction. 2. Trace pelvic free fluid, nonspecific. 3. No evidence of bowel obstruction or ileus. No CT findings to explain the  patient's diarrhea and nausea/vomiting. Electronically Signed   By: Randa Ngo M.D.   On: 08/27/2020 20:30   _______________________________________________________________________________________________________ Latest  Blood pressure (!) 136/91, pulse (!) 56, temperature 99.2 F (37.3 C), temperature source Oral, resp. rate 20, SpO2 100 %.   Review of Systems:    Pertinent positives include:    Fatigue,  abdominal pain, nausea, vomiting, diarrhea,   Constitutional:  No weight loss, night sweats, Fevers, chills, weight loss  HEENT:  No headaches, Difficulty swallowing,Tooth/dental problems,Sore throat,  No sneezing, itching, ear ache, nasal congestion, post nasal drip,  Cardio-vascular:  No chest pain, Orthopnea, PND, anasarca, dizziness, palpitations.no Bilateral lower extremity swelling  GI:  No heartburn, indigestion,change in bowel habits, loss of appetite, melena, blood in stool, hematemesis Resp:  no shortness of breath at rest. No dyspnea on exertion, No excess mucus, no productive cough, No non-productive cough, No coughing up of blood.No change in color of mucus.No wheezing. Skin:  no rash or lesions. No jaundice GU:  no dysuria, change in color of urine, no urgency or frequency. No straining to urinate.  No flank pain.  Musculoskeletal:  No joint pain or no joint swelling. No decreased range of motion. No back pain.  Psych:  No change in mood or affect. No depression or anxiety. No memory loss.  Neuro: no localizing neurological complaints, no tingling, no weakness, no double vision, no gait abnormality, no slurred speech, no confusion  All systems reviewed and apart from [place] all are negative _______________________________________________________________________________________________ Past Medical History:   Past Medical History:  Diagnosis Date  .  Hypertension   . Prostate cancer [facility])       Past Surgical History:  Procedure Laterality Date  . PROSTATE  BIOPSY    . WISDOM TOOTH EXTRACTION    . XI ROBOTIC ASSISTED INGUINAL HERNIA REPAIR WITH MESH      Social History:  Ambulatory   Independently      reports that he has never smoked. He has never used smokeless tobacco. He reports current alcohol use. He reports that he does not use drugs.   Family History:   Family History  Problem Relation Age of Onset  . Breast cancer Neg Hx   . Colon cancer Neg Hx   . Pancreatic cancer Neg Hx   . Prostate cancer Neg Hx    ______________________________________________________________________________________________ Allergies: No Known Allergies   Prior to Admission medications   Medication Sig Start Date End Date Taking? Authorizing Provider  amLODipine (NORVASC) 5 MG tablet Take 5 mg by mouth daily. 08/11/19   [provider]  amoxicillin (AMOXIL) 500 MG capsule Take 500 mg by mouth 3 (three) times daily. 12/01/19   [provider]  tamsulosin (FLOMAX) 0.4 MG CAPS capsule Take 0.4 mg by mouth daily. 10/25/19   [provider]  Vitamin D, Ergocalciferol, (DRISDOL) 1.25 MG (50000 UNIT) CAPS capsule Take by mouth. 10/29/19   [provider]    ___________________________________________________________________________________________________ Physical Exam: Vitals with BMI 08/27/2020 08/27/2020 08/27/2020  Height - - -  Weight - - -  BMI - - -  Systolic 119 417 408  Diastolic 91 97 144  Pulse 56 76 66    1. General:  in No Acute distress   Chronically ill -appearing 2. Psychological: Alert and  Oriented 3. Head/ENT:     Dry Mucous Membranes                          Head Non traumatic, neck supple                           Poor Dentition 4. SKIN decreased Skin turgor,  Skin clean Dry and intact no rash 5. Heart: Regular rate and rhythm no  Murmur, no Rub or gallop 6. Lungs:  no wheezes or crackles   7. Abdomen: Soft,   non-tender, Non distended  bowel sounds present 8. Lower extremities: no clubbing,  cyanosis, no edema 9. Neurologically Grossly intact, moving all 4 extremities equally  10. MSK: Normal range of motion    Chart has been reviewed  ______________________________________________________________________________________________  Assessment/Plan  65 y.o. male with medical history significant of  Prostate Ca, hypertension  Admitted for AKI and hypokalemia  Present on Admission: . Nausea & vomiting/ diarrhea - gastric panel ordered but diarrhea has resolved  . AKI (acute kidney injury) ([place]) -   evidence of acute renal failure due to presence of following: Cr increased >0.3 from baseline  likely secondary to dehydration,  urinary retention        check FeNA       Rehydrate with IV fluids      If persists despite fluid resuscitation will need renal consult.   . Bacteriuria - order urine culture, for tonight cover with rocephin await results of urine culture  . Dehydration - will rehydrate follow fluid status  . Hypokalemia - was replaced earlier will recheck    . Acute urinary retention - sp foley, continue flomax may need follow up with urology as  an outpt   Other plan as per orders.  DVT prophylaxis:  SCD        Code Status:    Code Status: Not on file FULL CODE   as per patient   I had personally discussed CODE STATUS with patient      Family Communication:   Family not at  Bedside    Disposition Plan:      To home once workup is complete and patient is stable   Following barriers for discharge:                            Electrolytes corrected                       Consults called: none  Admission status:  ED Disposition    ED Disposition Condition [place]: [place] [100102]  Level of Care: Med-Surg [16]  Covid Evaluation: Asymptomatic Screening Protocol (No Symptoms)  Diagnosis: Nausea & vomiting [626948]  Admitting Physician: Beryle Flock  Attending Physician: Rise Patience  [[id number]]        Obs    Level of care       medical floor        Lab Results  Component Value Date   [place] 08/27/2020     Precautions: admitted as   Covid Negative    PPE: Used by the provider:   N95  eye Goggles,  Gloves       Dorlisa Savino 08/28/2020, 12:53 AM    [facility]     after 2 AM please page floor coverage PA If 7AM-7PM, please contact the day team taking care of the patient using [email or web address]   Patient was evaluated in the context of the global COVID-19 pandemic, which necessitated consideration that the patient might be at risk for infection with the SARS-CoV-2 virus that causes COVID-19. Institutional protocols and algorithms that pertain to the evaluation of patients at risk for COVID-19 are in a state of rapid change based on information released by regulatory bodies including the CDC and federal and state organizations. These policies and algorithms were followed during the patient's care.

## 2020-08-27 NOTE — ED Triage Notes (Signed)
Sx started 10am 5/28 with diarrhea then last night has had N & V  Emesis x3 today Pain constant at 5/10 with intermittent 8/10  Believes he may have eaten "bad chicken"

## 2020-08-27 NOTE — ED Notes (Signed)
Bladder Scan: 146mL- 431mL

## 2020-08-27 NOTE — ED Notes (Addendum)
Per previous RN AP, family informed of transfer and visitation hours; pt declined this RN contact anyone

## 2020-08-28 ENCOUNTER — Inpatient Hospital Stay (HOSPITAL_COMMUNITY): Payer: BC Managed Care – PPO

## 2020-08-28 ENCOUNTER — Encounter (HOSPITAL_COMMUNITY): Payer: Self-pay | Admitting: Internal Medicine

## 2020-08-28 DIAGNOSIS — E860 Dehydration: Secondary | ICD-10-CM | POA: Diagnosis present

## 2020-08-28 DIAGNOSIS — R338 Other retention of urine: Secondary | ICD-10-CM | POA: Diagnosis present

## 2020-08-28 DIAGNOSIS — N179 Acute kidney failure, unspecified: Secondary | ICD-10-CM | POA: Diagnosis present

## 2020-08-28 DIAGNOSIS — N401 Enlarged prostate with lower urinary tract symptoms: Secondary | ICD-10-CM | POA: Diagnosis present

## 2020-08-28 DIAGNOSIS — R112 Nausea with vomiting, unspecified: Secondary | ICD-10-CM | POA: Diagnosis present

## 2020-08-28 DIAGNOSIS — I10 Essential (primary) hypertension: Secondary | ICD-10-CM | POA: Diagnosis present

## 2020-08-28 DIAGNOSIS — R8271 Bacteriuria: Secondary | ICD-10-CM | POA: Diagnosis present

## 2020-08-28 DIAGNOSIS — E876 Hypokalemia: Secondary | ICD-10-CM | POA: Diagnosis present

## 2020-08-28 DIAGNOSIS — K529 Noninfective gastroenteritis and colitis, unspecified: Secondary | ICD-10-CM | POA: Diagnosis not present

## 2020-08-28 DIAGNOSIS — Z8546 Personal history of malignant neoplasm of prostate: Secondary | ICD-10-CM | POA: Diagnosis not present

## 2020-08-28 DIAGNOSIS — Z79899 Other long term (current) drug therapy: Secondary | ICD-10-CM | POA: Diagnosis not present

## 2020-08-28 DIAGNOSIS — A059 Bacterial foodborne intoxication, unspecified: Secondary | ICD-10-CM | POA: Diagnosis present

## 2020-08-28 DIAGNOSIS — Z20822 Contact with and (suspected) exposure to covid-19: Secondary | ICD-10-CM | POA: Diagnosis present

## 2020-08-28 DIAGNOSIS — N138 Other obstructive and reflux uropathy: Secondary | ICD-10-CM | POA: Diagnosis present

## 2020-08-28 DIAGNOSIS — R1030 Lower abdominal pain, unspecified: Secondary | ICD-10-CM | POA: Diagnosis present

## 2020-08-28 LAB — BASIC METABOLIC PANEL
Anion gap: 6 (ref 5–15)
BUN: 19 mg/dL (ref 8–23)
CO2: 25 mmol/L (ref 22–32)
Calcium: 8.8 mg/dL — ABNORMAL LOW (ref 8.9–10.3)
Chloride: 111 mmol/L (ref 98–111)
Creatinine, Ser: 2.93 mg/dL — ABNORMAL HIGH (ref 0.61–1.24)
GFR, Estimated: 23 mL/min — ABNORMAL LOW (ref >60)
Glucose, Bld: 106 mg/dL — ABNORMAL HIGH (ref 70–99)
Potassium: 3.5 mmol/L (ref 3.5–5.1)
Sodium: 142 mmol/L (ref 135–145)

## 2020-08-28 LAB — CBC WITH DIFFERENTIAL/PLATELET
Abs Immature Granulocytes: 0.05 10*3/uL (ref 0.00–0.07)
Basophils Absolute: 0 10*3/uL (ref 0.0–0.1)
Basophils Relative: 0 %
Eosinophils Absolute: 0 10*3/uL (ref 0.0–0.5)
Eosinophils Relative: 0 %
HCT: 38.5 % — ABNORMAL LOW (ref 39.0–52.0)
Hemoglobin: 11.8 g/dL — ABNORMAL LOW (ref 13.0–17.0)
Immature Granulocytes: 1 %
Lymphocytes Relative: 13 %
Lymphs Abs: 1.5 10*3/uL (ref 0.7–4.0)
MCH: 21.7 pg — ABNORMAL LOW (ref 26.0–34.0)
MCHC: 30.6 g/dL (ref 30.0–36.0)
MCV: 70.8 fL — ABNORMAL LOW (ref 80.0–100.0)
Monocytes Absolute: 0.9 10*3/uL (ref 0.1–1.0)
Monocytes Relative: 8 %
Neutro Abs: 8.4 10*3/uL — ABNORMAL HIGH (ref 1.7–7.7)
Neutrophils Relative %: 78 %
Platelets: 250 10*3/uL (ref 150–400)
RBC: 5.44 MIL/uL (ref 4.22–5.81)
RDW: 18.2 % — ABNORMAL HIGH (ref 11.5–15.5)
WBC: 10.8 10*3/uL — ABNORMAL HIGH (ref 4.0–10.5)
nRBC: 0 % (ref 0.0–0.2)

## 2020-08-28 LAB — HIV ANTIBODY (ROUTINE TESTING W REFLEX): HIV Screen 4th Generation wRfx: NONREACTIVE

## 2020-08-28 LAB — CREATININE, URINE, RANDOM: Creatinine, Urine: 58.78 mg/dL

## 2020-08-28 LAB — PHOSPHORUS: Phosphorus: 3.6 mg/dL (ref 2.5–4.6)

## 2020-08-28 LAB — COMPREHENSIVE METABOLIC PANEL
ALT: 33 U/L (ref 0–44)
AST: 31 U/L (ref 15–41)
Albumin: 3.4 g/dL — ABNORMAL LOW (ref 3.5–5.0)
Alkaline Phosphatase: 51 U/L (ref 38–126)
Anion gap: 7 (ref 5–15)
BUN: 19 mg/dL (ref 8–23)
CO2: 24 mmol/L (ref 22–32)
Calcium: 8.6 mg/dL — ABNORMAL LOW (ref 8.9–10.3)
Chloride: 111 mmol/L (ref 98–111)
Creatinine, Ser: 2.64 mg/dL — ABNORMAL HIGH (ref 0.61–1.24)
GFR, Estimated: 26 mL/min — ABNORMAL LOW (ref >60)
Glucose, Bld: 121 mg/dL — ABNORMAL HIGH (ref 70–99)
Potassium: 3.4 mmol/L — ABNORMAL LOW (ref 3.5–5.1)
Sodium: 142 mmol/L (ref 135–145)
Total Bilirubin: 1 mg/dL (ref 0.3–1.2)
Total Protein: 6.3 g/dL — ABNORMAL LOW (ref 6.5–8.1)

## 2020-08-28 LAB — MAGNESIUM: Magnesium: 2.3 mg/dL (ref 1.7–2.4)

## 2020-08-28 LAB — CK: Total CK: 727 U/L — ABNORMAL HIGH (ref 49–397)

## 2020-08-28 LAB — TSH: TSH: 0.456 u[IU]/mL (ref 0.350–4.500)

## 2020-08-28 LAB — SODIUM, URINE, RANDOM: Sodium, Ur: 55 mmol/L

## 2020-08-28 MED ORDER — TAMSULOSIN HCL 0.4 MG PO CAPS
0.4000 mg | ORAL_CAPSULE | Freq: Every day | ORAL | Status: DC
  Administered 2020-08-28 – 2020-08-30 (×3): 0.4 mg via ORAL
  Filled 2020-08-28 (×3): qty 1

## 2020-08-28 MED ORDER — SODIUM CHLORIDE 0.9 % IV SOLN
1.0000 g | INTRAVENOUS | Status: DC
  Administered 2020-08-28 – 2020-08-29 (×2): 1 g via INTRAVENOUS
  Filled 2020-08-28 (×2): qty 1

## 2020-08-28 MED ORDER — ACETAMINOPHEN 650 MG RE SUPP: 650.0000 mg | Freq: Four times a day (QID) | RECTAL | Status: DC | PRN

## 2020-08-28 MED ORDER — ACETAMINOPHEN 325 MG PO TABS: 650.0000 mg | ORAL_TABLET | Freq: Four times a day (QID) | ORAL | Status: DC | PRN

## 2020-08-28 MED ORDER — SODIUM CHLORIDE 0.9 % IV SOLN
75.0000 mL/h | INTRAVENOUS | Status: AC
  Administered 2020-08-28: 75 mL/h via INTRAVENOUS

## 2020-08-28 MED ORDER — HYDROCODONE-ACETAMINOPHEN 5-325 MG PO TABS: 1.0000 | ORAL_TABLET | ORAL | Status: DC | PRN

## 2020-08-28 NOTE — Hospital Course (Addendum)
Patrick Everett is a 65 y.o. male with medical history significant of  prostate Ca, hypertension who presented with ongoing nausea vomiting and diarrhea and lower abdominal pain that started morning of 28 May.  Pt attributes symptoms to eating leftover chicken that had sat out for too long.  Not tolerating oral intake.  Found to have acute urinary retention, foley placed. Does take Flomax, which he had not been able to take. Flomax was resumed inpatient.  Voiding trial 5/31-6/1 was unsuccessful, Foley had to be replaced.  Patient follows with Dr. Lovena Neighbours for prostate cancer under surveillance.  Follow up in clinic in 1-2 weeks.  Admitted with acute kidney injury treated with IV fluids and Foley placement, empiric antibiotic for possible UTI, given bacteruria and risk factor of urinary retention.  Urine culture no growth.  Antibiotics d/c'd.

## 2020-08-28 NOTE — Progress Notes (Signed)
Pt arrived to unit from [facility]. Admission has been paged.

## 2020-08-28 NOTE — Progress Notes (Signed)
PROGRESS NOTE    Patrick Everett   FYB:[id number]  DOB: 1956-01-06  PCP: Kristie Cowman, MD    DOA: 08/27/2020 LOS: 0   Brief Narrative   Patrick Everett is a 65 y.o. male with medical history significant of  prostate Ca, hypertension who presented with ongoing nausea vomiting and diarrhea and lower abdominal pain that started morning of 28 May.  Pt attributes symptoms to eating leftover chicken that had sat out for too long.  Not tolerating oral intake.  Found to have acute urinary retention, foley placed. Does take Flomax, which he had not been able to take.  Admitted to [facility] service with acute kidney injury, on empiric antibiotic for possible UTI, given bacteruria and risk factor of urinary retention, pending culture.  Also on IV fluids.    Assessment & Plan   Active Problems:   Nausea & vomiting   AKI (acute kidney injury) ([place])   Bacteriuria   Dehydration   Hypokalemia   Acute urinary retention  Elevated Creatinine, suspect Acute kidney injury  No baseline Cr available, but presented with Cr 3.41 in setting of N/V/D and poor PO intake, in addition to acute urinary retention. Pt denies hx of chronic kidney disease. -- Continue Foley for now, voiding trial tomorrow morning -- Continue Flomax -- Continue IV fluids -- Monitor BMP -- Renal U/S pending  Nausea vomiting diarrhea with secondary dehydration-which patient attributes to foodborne illness.   Diarrhea has resolved.  Nausea and vomiting also improved but no fully resolved yet. 5/30 - not yet tolerating adequate PO to sustain hydration without IV fluids  -- Advance diet to soft as tolerated -- Continue IV fluids until taking adequate p.o. -- Antiemetics as needed -- Follow-up GI panel and C. Difficile  Bacteriuria -follow-up urine culture and continue empiric Rocephin for now.  Urinary retention places him at higher risk for UTI, endorses some frequency.  Hypokalemia -replaced.  Monitor BMP and replace further as  needed.  Acute urinary retention -continue Foley.  Continue Flomax.  Voiding trial first thing tomorrow morning.  Urology outpatient follow-up as needed.  Patient BMI: There is no height or weight on file to calculate BMI.   DVT prophylaxis: SCDs Start: 08/28/20 0019   Diet:  Diet Orders (From admission, onward)    Start     Ordered   08/28/20 1133  DIET SOFT Room service appropriate? Yes; Fluid consistency: Thin  Diet effective now       Question Answer Comment  Room service appropriate? Yes   Fluid consistency: Thin      08/28/20 1132            Code Status: Full Code    Subjective 08/28/20    Pt seen this AM, reporting feels better but has not yet been able to eat or drink very much.  No fever/chills.  Denies hx of chronic renal disease or need for Foley.   Disposition Plan & Communication   Status is: Inpatient  Remains inpatient appropriate because:IV treatments appropriate due to intensity of illness or inability to take PO   Dispo: The patient is from: Home              Anticipated d/c is to: Home              Patient currently is not medically stable to d/c.   Difficult to place patient No   Family Communication: male at bedside on rounds   Consults, Procedures, Significant Events   Consultants:  None  Procedures:   Foley placed 5/29  Antimicrobials:  Anti-infectives (From admission, onward)   Start     Dose/Rate Route Frequency Ordered Stop   08/28/20 0145  cefTRIAXone (ROCEPHIN) 1 g in sodium chloride 0.9 % 100 mL IVPB        1 g 200 mL/hr over 30 Minutes Intravenous Every 24 hours 08/28/20 0048          Micro    Objective   Vitals:   08/27/20 1934 08/27/20 2030 08/27/20 2355 08/28/20 0354  BP: (!) 158/100 (!) 159/97 (!) 136/91 120/74  Pulse: 66 76 (!) 56 (!) 55  Resp: 18 19 20 17   Temp:   99.2 F (37.3 C) 98.4 F (36.9 C)  TempSrc:   Oral Oral  SpO2: 100% 97% 100% 100%    Intake/Output Summary (Last 24 hours) at  08/28/2020 1357 Last data filed at 08/28/2020 1208 Gross per 24 hour  Intake 3809.81 ml  Output 5950 ml  Net -2140.19 ml   There were no vitals filed for this visit.  Physical Exam:  General exam: awake, alert, no acute distress HEENT: atraumatic, clear conjunctiva, anicteric sclera moist mucus membranes, hearing grossly normal  Respiratory system: CTAB, no wheezes, rales or rhonchi, normal respiratory effort. Cardiovascular system: normal S1/S2, RRR, no JVD, murmurs, rubs, gallops, no pedal edema.   Gastrointestinal system: soft, NT, ND, no HSM felt, +bowel sounds. Central nervous system: A&O x3. no gross focal neurologic deficits, normal speech Extremities: moves all, no edema, normal tone Psychiatry: normal mood, congruent affect, judgement and insight appear normal  Labs   Data Reviewed: I have personally reviewed following labs and imaging studies  CBC: Recent Labs  Lab 08/27/20 1851 08/28/20 0312  WBC 14.9* 10.8*  NEUTROABS 12.7* 8.4*  HGB 12.6* 11.8*  HCT 39.4 38.5*  MCV 69.7* 70.8*  PLT 257 672   Basic Metabolic Panel: Recent Labs  Lab 08/27/20 1851 08/28/20 0052 08/28/20 0312  NA 133* 142 142  K 3.1* 3.5 3.4*  CL 103 111 111  CO2 19* 25 24  GLUCOSE 120* 106* 121*  BUN 20 19 19   CREATININE 3.41* 2.93* 2.64*  CALCIUM 8.6* 8.8* 8.6*  MG 2.1  --  2.3  PHOS  --   --  3.6   GFR: CrCl cannot be calculated (Unknown ideal weight.). Liver Function Tests: Recent Labs  Lab 08/27/20 1851 08/28/20 0312  AST 44* 31  ALT 39 33  ALKPHOS 59 51  BILITOT 1.4* 1.0  PROT 6.9 6.3*  ALBUMIN 3.8 3.4*   Recent Labs  Lab 08/27/20 1851  LIPASE 29   No results for input(s): AMMONIA in the last 168 hours. Coagulation Profile: No results for input(s): INR, PROTIME in the last 168 hours. Cardiac Enzymes: Recent Labs  Lab 08/28/20 0052  CKTOTAL 727*   BNP (last 3 results) No results for input(s): PROBNP in the last 8760 hours. HbA1C: No results for input(s):  HGBA1C in the last 72 hours. CBG: No results for input(s): GLUCAP in the last 168 hours. Lipid Profile: No results for input(s): CHOL, HDL, LDLCALC, TRIG, CHOLHDL, LDLDIRECT in the last 72 hours. Thyroid Function Tests: Recent Labs    08/28/20 0312  TSH 0.456   Anemia Panel: No results for input(s): VITAMINB12, FOLATE, FERRITIN, TIBC, IRON, RETICCTPCT in the last 72 hours. Sepsis Labs: No results for input(s): PROCALCITON, LATICACIDVEN in the last 168 hours.  Recent Results (from the past 240 hour(s))  Resp Panel by RT-PCR (Flu A&B, Covid)  Nasopharyngeal Swab     Status: None   Collection Time: 08/27/20  8:33 PM   Specimen: Nasopharyngeal Swab; Nasopharyngeal(NP) swabs in vial transport medium  Result Value Ref Range Status   SARS Coronavirus 2 by RT PCR NEGATIVE NEGATIVE Final    Comment: (NOTE) SARS-CoV-2 target nucleic acids are NOT DETECTED.  The SARS-CoV-2 RNA is generally detectable in upper respiratory specimens during the acute phase of infection. The lowest concentration of SARS-CoV-2 viral copies this assay can detect is 138 copies/mL. A negative result does not preclude SARS-Cov-2 infection and should not be used as the sole basis for treatment or other patient management decisions. A negative result may occur with  improper specimen collection/handling, submission of specimen other than nasopharyngeal swab, presence of viral mutation(s) within the areas targeted by this assay, and inadequate number of viral copies(<138 copies/mL). A negative result must be combined with clinical observations, patient history, and epidemiological information. The expected result is Negative.  Fact Sheet for Patients:  EntrepreneurPulse.com.au  Fact Sheet for Healthcare Providers:  IncredibleEmployment.be  This test is no t yet approved or cleared by the [place] FDA and  has been authorized for detection and/or diagnosis of SARS-CoV-2  by FDA under an Emergency Use Authorization (EUA). This EUA will remain  in effect (meaning this test can be used) for the duration of the COVID-19 declaration under Section 564(b)(1) of the Act, 21 U.S.C.section 360bbb-3(b)(1), unless the authorization is terminated  or revoked sooner.       Influenza A by PCR NEGATIVE NEGATIVE Final   Influenza B by PCR NEGATIVE NEGATIVE Final    Comment: (NOTE) The Xpert Xpress SARS-CoV-2/FLU/RSV plus assay is intended as an aid in the diagnosis of influenza from Nasopharyngeal swab specimens and should not be used as a sole basis for treatment. Nasal washings and aspirates are unacceptable for Xpert Xpress SARS-CoV-2/FLU/RSV testing.  Fact Sheet for Patients: EntrepreneurPulse.com.au  Fact Sheet for Healthcare Providers: IncredibleEmployment.be  This test is not yet approved or cleared by the [place] FDA and has been authorized for detection and/or diagnosis of SARS-CoV-2 by FDA under an Emergency Use Authorization (EUA). This EUA will remain in effect (meaning this test can be used) for the duration of the COVID-19 declaration under Section 564(b)(1) of the Act, 21 U.S.C. section 360bbb-3(b)(1), unless the authorization is terminated or revoked.  Performed at [facility], [address] [place], [place] [address]       Imaging Studies   CT ABDOMEN PELVIS WO CONTRAST  Result Date: 08/27/2020 CLINICAL DATA:  Diarrhea for 2 days, nausea and vomiting, emesis, pain EXAM: CT ABDOMEN AND PELVIS WITHOUT CONTRAST TECHNIQUE: Multidetector CT imaging of the abdomen and pelvis was performed following the standard protocol without IV contrast. COMPARISON:  None. FINDINGS: Lower chest: No acute pleural or parenchymal lung disease. Hepatobiliary: Unenhanced imaging of the liver demonstrates no gross abnormalities. No biliary duct dilation. The gallbladder is unremarkable. Pancreas: Unremarkable. No  pancreatic ductal dilatation or surrounding inflammatory changes. Spleen: Normal in size without focal abnormality. Adrenals/Urinary Tract: No evidence of urinary tract calculi. There is mild distension of the bilateral renal collecting systems and ureters, likely due to significant bladder distension. Bladder distension is likely due to chronic bladder outlet obstruction given a markedly enlarged and heterogeneous prostate. Significant bilateral perirenal fat stranding also felt to be the sequela of chronic bladder outlet obstruction. The adrenals are grossly normal. Stomach/Bowel: No bowel obstruction or ileus. Normal appendix right lower quadrant. No bowel  wall thickening or inflammatory change. Vascular/Lymphatic: No significant vascular findings on this unenhanced exam. No pathologic adenopathy within the abdomen or pelvis. Reproductive: The prostate is heterogeneous and enlarged, measuring approximately 6.3 x 7.4 x 6.3 cm. Other: There is trace free fluid within the pelvis. No free intraperitoneal gas. No abdominal wall hernia. Musculoskeletal: No acute or destructive bony lesions. Reconstructed images demonstrate no additional findings. IMPRESSION: 1. Markedly enlarged prostate, with evidence of chronic bladder outlet obstruction. 2. Trace pelvic free fluid, nonspecific. 3. No evidence of bowel obstruction or ileus. No CT findings to explain the patient's diarrhea and nausea/vomiting. Electronically Signed   By: Randa Ngo M.D.   On: 08/27/2020 20:30     Medications   Scheduled Meds: . tamsulosin  0.4 mg Oral Daily   Continuous Infusions: . cefTRIAXone (ROCEPHIN)  IV Stopped (08/28/20 0203)       LOS: 0 days    Time spent: 30 minutes    Ezekiel Slocumb, DO [facility]  08/28/2020, 1:57 PM      If 7PM-7AM, please contact night-coverage. How to contact the [facility] Attending or Consulting provider [place] or covering provider during after hours [place], for this patient?     1. Check the care team in [facility] and look for a) attending/consulting [facility] provider listed and b) the [facility] team listed 2. Log into [email or web address] and use [place]'s universal password to access. If you do not have the password, please contact the hospital operator. 3. Locate the [facility] provider you are looking for under [facility] and page to a number that you can be directly reached. 4. If you still have difficulty reaching the provider, please page the [facility] (Director on Call) for the Hospitalists listed on amion for assistance.

## 2020-08-28 NOTE — Plan of Care (Signed)
Plan of care initiated.

## 2020-08-28 NOTE — Progress Notes (Signed)
Lab resulted potassium 3.5. Per Dr. Roel Cluck, the patient may remain off telemetry if the potassium resulted 3.5 and above.

## 2020-08-29 LAB — BASIC METABOLIC PANEL
Anion gap: 5 (ref 5–15)
BUN: 18 mg/dL (ref 8–23)
CO2: 27 mmol/L (ref 22–32)
Calcium: 8.2 mg/dL — ABNORMAL LOW (ref 8.9–10.3)
Chloride: 107 mmol/L (ref 98–111)
Creatinine, Ser: 1.65 mg/dL — ABNORMAL HIGH (ref 0.61–1.24)
GFR, Estimated: 46 mL/min — ABNORMAL LOW (ref >60)
Glucose, Bld: 104 mg/dL — ABNORMAL HIGH (ref 70–99)
Potassium: 3.5 mmol/L (ref 3.5–5.1)
Sodium: 139 mmol/L (ref 135–145)

## 2020-08-29 LAB — CBC
HCT: 37 % — ABNORMAL LOW (ref 39.0–52.0)
Hemoglobin: 11.2 g/dL — ABNORMAL LOW (ref 13.0–17.0)
MCH: 21.8 pg — ABNORMAL LOW (ref 26.0–34.0)
MCHC: 30.3 g/dL (ref 30.0–36.0)
MCV: 72.1 fL — ABNORMAL LOW (ref 80.0–100.0)
Platelets: 222 10*3/uL (ref 150–400)
RBC: 5.13 MIL/uL (ref 4.22–5.81)
RDW: 17.7 % — ABNORMAL HIGH (ref 11.5–15.5)
WBC: 9.9 10*3/uL (ref 4.0–10.5)
nRBC: 0 % (ref 0.0–0.2)

## 2020-08-29 LAB — URINE CULTURE: Culture: NO GROWTH

## 2020-08-29 LAB — OSMOLALITY, URINE: Osmolality, Ur: 198 mOsm/kg — ABNORMAL LOW (ref 300–900)

## 2020-08-29 MED ORDER — CHLORHEXIDINE GLUCONATE CLOTH 2 % EX PADS
6.0000 | MEDICATED_PAD | Freq: Every day | CUTANEOUS | Status: DC
  Administered 2020-08-30: 6 via TOPICAL

## 2020-08-29 NOTE — Progress Notes (Signed)
Patient unable to empty void, Bladder scan 821cc, Dr Eliseo Squires notified with orders received. Bethann Punches RN

## 2020-08-29 NOTE — Progress Notes (Signed)
PROGRESS NOTE    Patrick Everett   WUJ:[id number]  DOB: 06/19/1955  PCP: Kristie Cowman, MD    DOA: 08/27/2020 LOS: 1   Brief Narrative   Patrick Everett is a 65 y.o. male with medical history significant of  prostate Ca, hypertension who presented with ongoing nausea vomiting and diarrhea and lower abdominal pain that started morning of 28 May.  Pt attributes symptoms to eating leftover chicken that had sat out for too long.  Found to have acute urinary retention vs chronic, foley placed.  Voiding trial on 5/31-- hope for d/c on 6/1.     Assessment & Plan   Active Problems:   Nausea & vomiting   AKI (acute kidney injury) ([place])   Bacteriuria   Dehydration   Hypokalemia   Acute urinary retention   Nausea and vomiting   Elevated Creatinine, suspect Acute kidney injury  No baseline Cr available, but presented with Cr 3.41 in setting of N/V/D and poor PO intake, in addition to acute urinary retention. Pt denies hx of chronic kidney disease. --  voiding trial  -- Continue Flomax -- daily BMP -- Renal U/S normal  Nausea vomiting diarrhea with secondary dehydration-which patient attributes to foodborne illness.   Diarrhea has resolved.  Nausea and vomiting resolved as well  -- Advance diet to soft  -- Antiemetics as needed  Bacteriuria  -d/c abx -culture with NGTD  Hypokalemia  --replaced  Acute urinary retention  -d/c foley with voiding trial.  Continue Flomax.   -Urology outpatient follow-up as needed-- follows at [facility]   DVT prophylaxis: SCDs Start: 08/28/20 0019      Code Status: Full Code    Subjective 08/29/20    Eating better, ready to get foley out   Disposition Plan & Communication   Status is: Inpatient  Remains inpatient appropriate because:IV treatments appropriate due to intensity of illness or inability to take PO   Dispo: The patient is from: Home              Anticipated d/c is to: Home              Patient currently is not medically  stable to d/c. home in AM-- stable Cr and foley d/c'd   Difficult to place patient No      Objective   Vitals:   08/28/20 0354 08/28/20 1428 08/28/20 2135 08/29/20 0557  BP: 120/74 127/85 140/83 129/89  Pulse: (!) 55 72 66 62  Resp: 17 18 18 18   Temp: 98.4 F (36.9 C) 99 F (37.2 C) 99.6 F (37.6 C) 98.6 F (37 C)  TempSrc: Oral Oral Oral Oral  SpO2: 100% 100% 99% 98%    Intake/Output Summary (Last 24 hours) at 08/29/2020 1208 Last data filed at 08/29/2020 1120 Gross per 24 hour  Intake 1380 ml  Output 1650 ml  Net -270 ml    Physical Exam:   General: Appearance:     Overweight male in no acute distress     Lungs:     respirations unlabored  Heart:    Normal heart rate. Normal rhythm. No murmurs, rubs, or gallops.   MS:   All extremities are intact.   Neurologic:   Awake, alert, oriented x 3. No apparent focal neurological           defect.     Labs   Data Reviewed: I have personally reviewed following labs and imaging studies  CBC: Recent Labs  Lab 08/27/20 1851 08/28/20 0312 08/29/20  0249  WBC 14.9* 10.8* 9.9  NEUTROABS 12.7* 8.4*  --   HGB 12.6* 11.8* 11.2*  HCT 39.4 38.5* 37.0*  MCV 69.7* 70.8* 72.1*  PLT 257 250 027   Basic Metabolic Panel: Recent Labs  Lab 08/27/20 1851 08/28/20 0052 08/28/20 0312 08/29/20 0249  NA 133* 142 142 139  K 3.1* 3.5 3.4* 3.5  CL 103 111 111 107  CO2 19* 25 24 27   GLUCOSE 120* 106* 121* 104*  BUN 20 19 19 18   CREATININE 3.41* 2.93* 2.64* 1.65*  CALCIUM 8.6* 8.8* 8.6* 8.2*  MG 2.1  --  2.3  --   PHOS  --   --  3.6  --    GFR: CrCl cannot be calculated (Unknown ideal weight.). Liver Function Tests: Recent Labs  Lab 08/27/20 1851 08/28/20 0312  AST 44* 31  ALT 39 33  ALKPHOS 59 51  BILITOT 1.4* 1.0  PROT 6.9 6.3*  ALBUMIN 3.8 3.4*   Recent Labs  Lab 08/27/20 1851  LIPASE 29   No results for input(s): AMMONIA in the last 168 hours. Coagulation Profile: No results for input(s): INR, PROTIME in  the last 168 hours. Cardiac Enzymes: Recent Labs  Lab 08/28/20 0052  CKTOTAL 727*   BNP (last 3 results) No results for input(s): PROBNP in the last 8760 hours. HbA1C: No results for input(s): HGBA1C in the last 72 hours. CBG: No results for input(s): GLUCAP in the last 168 hours. Lipid Profile: No results for input(s): CHOL, HDL, LDLCALC, TRIG, CHOLHDL, LDLDIRECT in the last 72 hours. Thyroid Function Tests: Recent Labs    08/28/20 0312  TSH 0.456   Anemia Panel: No results for input(s): VITAMINB12, FOLATE, FERRITIN, TIBC, IRON, RETICCTPCT in the last 72 hours. Sepsis Labs: No results for input(s): PROCALCITON, LATICACIDVEN in the last 168 hours.  Recent Results (from the past 240 hour(s))  Resp Panel by RT-PCR (Flu A&B, Covid) Nasopharyngeal Swab     Status: None   Collection Time: 08/27/20  8:33 PM   Specimen: Nasopharyngeal Swab; Nasopharyngeal(NP) swabs in vial transport medium  Result Value Ref Range Status   SARS Coronavirus 2 by RT PCR NEGATIVE NEGATIVE Final    Comment: (NOTE) SARS-CoV-2 target nucleic acids are NOT DETECTED.  The SARS-CoV-2 RNA is generally detectable in upper respiratory specimens during the acute phase of infection. The lowest concentration of SARS-CoV-2 viral copies this assay can detect is 138 copies/mL. A negative result does not preclude SARS-Cov-2 infection and should not be used as the sole basis for treatment or other patient management decisions. A negative result may occur with  improper specimen collection/handling, submission of specimen other than nasopharyngeal swab, presence of viral mutation(s) within the areas targeted by this assay, and inadequate number of viral copies(<138 copies/mL). A negative result must be combined with clinical observations, patient history, and epidemiological information. The expected result is Negative.  Fact Sheet for Patients:  EntrepreneurPulse.com.au  Fact Sheet for  Healthcare Providers:  IncredibleEmployment.be  This test is no t yet approved or cleared by the [place] FDA and  has been authorized for detection and/or diagnosis of SARS-CoV-2 by FDA under an Emergency Use Authorization (EUA). This EUA will remain  in effect (meaning this test can be used) for the duration of the COVID-19 declaration under Section 564(b)(1) of the Act, 21 U.S.C.section 360bbb-3(b)(1), unless the authorization is terminated  or revoked sooner.       Influenza A by PCR NEGATIVE NEGATIVE Final   Influenza B by  PCR NEGATIVE NEGATIVE Final    Comment: (NOTE) The Xpert Xpress SARS-CoV-2/FLU/RSV plus assay is intended as an aid in the diagnosis of influenza from Nasopharyngeal swab specimens and should not be used as a sole basis for treatment. Nasal washings and aspirates are unacceptable for Xpert Xpress SARS-CoV-2/FLU/RSV testing.  Fact Sheet for Patients: EntrepreneurPulse.com.au  Fact Sheet for Healthcare Providers: IncredibleEmployment.be  This test is not yet approved or cleared by the [place] FDA and has been authorized for detection and/or diagnosis of SARS-CoV-2 by FDA under an Emergency Use Authorization (EUA). This EUA will remain in effect (meaning this test can be used) for the duration of the COVID-19 declaration under Section 564(b)(1) of the Act, 21 U.S.C. section 360bbb-3(b)(1), unless the authorization is terminated or revoked.  Performed at [facility], [address] [place], [place] [address]   Culture, Urine     Status: None   Collection Time: 08/28/20  2:24 AM   Specimen: Urine, Catheterized  Result Value Ref Range Status   Specimen Description   Final    URINE, CATHETERIZED Performed at [facility] [address] [place], [place] [address]    Special Requests   Final    NONE Performed at [facility], [place]  [address] [place], [place] [address]    Culture   Final    NO GROWTH Performed at [facility], [place] [address] [place], [place] [address]    Report Status 08/29/2020 FINAL  Final      Imaging Studies   CT ABDOMEN PELVIS WO CONTRAST  Result Date: 08/27/2020 CLINICAL DATA:  Diarrhea for 2 days, nausea and vomiting, emesis, pain EXAM: CT ABDOMEN AND PELVIS WITHOUT CONTRAST TECHNIQUE: Multidetector CT imaging of the abdomen and pelvis was performed following the standard protocol without IV contrast. COMPARISON:  None. FINDINGS: Lower chest: No acute pleural or parenchymal lung disease. Hepatobiliary: Unenhanced imaging of the liver demonstrates no gross abnormalities. No biliary duct dilation. The gallbladder is unremarkable. Pancreas: Unremarkable. No pancreatic ductal dilatation or surrounding inflammatory changes. Spleen: Normal in size without focal abnormality. Adrenals/Urinary Tract: No evidence of urinary tract calculi. There is mild distension of the bilateral renal collecting systems and ureters, likely due to significant bladder distension. Bladder distension is likely due to chronic bladder outlet obstruction given a markedly enlarged and heterogeneous prostate. Significant bilateral perirenal fat stranding also felt to be the sequela of chronic bladder outlet obstruction. The adrenals are grossly normal. Stomach/Bowel: No bowel obstruction or ileus. Normal appendix right lower quadrant. No bowel wall thickening or inflammatory change. Vascular/Lymphatic: No significant vascular findings on this unenhanced exam. No pathologic adenopathy within the abdomen or pelvis. Reproductive: The prostate is heterogeneous and enlarged, measuring approximately 6.3 x 7.4 x 6.3 cm. Other: There is trace free fluid within the pelvis. No free intraperitoneal gas. No abdominal wall hernia. Musculoskeletal: No acute or destructive bony lesions. Reconstructed images demonstrate no additional findings.  IMPRESSION: 1. Markedly enlarged prostate, with evidence of chronic bladder outlet obstruction. 2. Trace pelvic free fluid, nonspecific. 3. No evidence of bowel obstruction or ileus. No CT findings to explain the patient's diarrhea and nausea/vomiting. Electronically Signed   By: Randa Ngo M.D.   On: 08/27/2020 20:30   US RENAL  Result Date: 08/28/2020 CLINICAL DATA:  Acute kidney injury EXAM: RENAL / URINARY TRACT ULTRASOUND COMPLETE COMPARISON:  08/27/2020 CT abdomen pelvis FINDINGS: Right Kidney: Renal measurements: 12.1 x 5.5 x 5.9 cm = volume: 204.4  mL. Echogenicity within normal limits. No mass or hydronephrosis visualized. Left Kidney: Renal measurements: 11.3 x 5.3 x 5.4 cm = volume: 170.4 mL. Echogenicity within normal limits. No mass or hydronephrosis visualized. Bladder: Appears normal for degree of bladder distention. Small amount of debris in the bladder. Foley catheter in the bladder. Other: None. IMPRESSION: 1. Normal renal ultrasound. Electronically Signed   By: Kathreen Devoid   On: 08/28/2020 15:24     Medications   Scheduled Meds: . Chlorhexidine Gluconate Cloth  6 each Topical Daily  . tamsulosin  0.4 mg Oral Daily   Continuous Infusions:      LOS: 1 day    Time spent: 25 minutes    Geradine Girt, DO [facility]  08/29/2020, 12:08 PM      If 7PM-7AM, please contact night-coverage. How to contact the [facility] Attending or Consulting provider [place] or covering provider during after hours [place], for this patient?    1. Check the care team in [facility] and look for a) attending/consulting [facility] provider listed and b) the [facility] team listed 2. Log into [email or web address] and use [place]'s universal password to access. If you do not have the password, please contact the hospital operator. 3. Locate the [facility] provider you are looking for under [facility] and page to a number that you can be directly reached. 4. If you still have difficulty reaching the provider,  please page the [facility] (Director on Call) for the Hospitalists listed on amion for assistance.

## 2020-08-30 LAB — BASIC METABOLIC PANEL WITH GFR
Anion gap: 6 (ref 5–15)
BUN: 12 mg/dL (ref 8–23)
CO2: 28 mmol/L (ref 22–32)
Calcium: 8.4 mg/dL — ABNORMAL LOW (ref 8.9–10.3)
Chloride: 106 mmol/L (ref 98–111)
Creatinine, Ser: 1.3 mg/dL — ABNORMAL HIGH (ref 0.61–1.24)
GFR, Estimated: >60 mL/min
Glucose, Bld: 122 mg/dL — ABNORMAL HIGH (ref 70–99)
Potassium: 3.2 mmol/L — ABNORMAL LOW (ref 3.5–5.1)
Sodium: 140 mmol/L (ref 135–145)

## 2020-08-30 NOTE — Discharge Summary (Signed)
Physician Discharge Summary  Patrick Everett UTM:[id number] DOB: 09/26/1955 DOA: 08/27/2020  PCP: Kristie Cowman, MD  Admit date: 08/27/2020 Discharge date: 08/30/2020  Admitted From: home Disposition:  home  Recommendations for Outpatient Follow-up:  1. Follow up with PCP in 1-2 weeks 2. Please obtain BMP/CBC in one week 3. Please follow up with urology, Dr. Lovena Neighbours, in 1 week  Home Health: No  Equipment/Devices: None   Discharge Condition: stable  CODE STATUS: full Diet recommendation: Regular   Discharge Diagnoses: Active Problems:   Nausea & vomiting   AKI (acute kidney injury) ([place])   Bacteriuria   Dehydration   Hypokalemia   Acute urinary retention   Nausea and vomiting    Summary of HPI and Hospital Course:  Patrick Everett is a 65 y.o. male with medical history significant of  prostate Ca, hypertension who presented with ongoing nausea vomiting and diarrhea and lower abdominal pain that started morning of 28 May.  Pt attributes symptoms to eating leftover chicken that had sat out for too long.  Not tolerating oral intake.  Found to have acute urinary retention, foley placed. Does take Flomax, which he had not been able to take. Flomax was resumed inpatient.  Voiding trial 5/31-6/1 was unsuccessful, Foley had to be replaced.  Patient follows with Dr. Lovena Neighbours for prostate cancer under surveillance.  Follow up in clinic in 1-2 weeks.  Admitted with acute kidney injury treated with IV fluids and Foley placement, empiric antibiotic for possible UTI, given bacteruria and risk factor of urinary retention.  Urine culture no growth.  Antibiotics d/c'd.   Failed voiding trial.  D/c with Foley in place.   Follow up with Dr. Lovena Neighbours in urology in 1 week for voiding trial and/or further evaluation.   Renal function improved.    Acute kidney injury - multifactoral due to post-renal obstructive uropathy and likely pre-renal azotemia due to dehydration given GI illness.  No baseline Cr available,  however presented with Cr 3.41 which has improved to 1.3. Pt denies hx of chronic kidney disease. --  voiding trial failed - urology follow up in a week or two  -- Continue Flomax -- daily BMP -- Renal U/S normal  Nausea vomiting diarrhea with secondary dehydration-which patient attributes to foodborne illness.   Diarrhea has resolved.  Nausea and vomiting resolved as well  -- Tolerating diet -- Antiemetics as needed  Bacteriuria  -d/c abx -culture with NGTD  Hypokalemia  --replaced  Acute urinary retention  -d/c foley with voiding trial.  Continue Flomax.   -Urology outpatient follow-up as needed-- follows at [facility]  Hx of prostate cancer under surveillance -  Seen by Dr. Lovena Neighbours.  May contribute to his retention.   Close outpatient follow up.    Discharge Instructions   Discharge Instructions    Call MD for:  extreme fatigue   Complete by: As directed    Call MD for:  persistant dizziness or light-headedness   Complete by: As directed    Call MD for:  persistant nausea and vomiting   Complete by: As directed    Call MD for:  severe uncontrolled pain   Complete by: As directed    Call MD for:  temperature >100.4   Complete by: As directed    Diet - low sodium heart healthy   Complete by: As directed    Discharge instructions   Complete by: As directed    Please follow up in about 1 week with Dr. Lovena Neighbours with urology.  I  discussed the problem with urine retention you are having.  Dr. Lovena Neighbours will see you in clinic, take catheter out and see if bladder empties on its own.  If there are ongoing issues with this, urology will do further evaluation.   Increase activity slowly   Complete by: As directed      Allergies as of 08/30/2020   No Known Allergies     Medication List    TAKE these medications   amLODipine 10 MG tablet Commonly known as: NORVASC Take 1 tablet by mouth daily.   tamsulosin 0.4 MG Caps capsule Commonly known as: FLOMAX Take 0.4  mg by mouth daily.       Follow-up Information    Ceasar Mons, MD. Schedule an appointment as soon as possible for a visit in 1 week(s).   Specialty: Urology Why: Follow up for acute urinary retention, failed voiding trial in hospital.  Discharging with Foley in place.  Hx of prostate cancer.  Contact information: [address] [place] [address] [phone]        Kristie Cowman, MD. Schedule an appointment as soon as possible for a visit in 1 week(s).   Specialty: Family Medicine Why: Hospital follow up Contact information: [place] [address] [phone]              No Known Allergies   If you experience worsening of your admission symptoms, develop shortness of breath, life threatening emergency, suicidal or homicidal thoughts you must seek medical attention immediately by calling 911 or calling your MD immediately  if symptoms less severe.    Please note   You were cared for by a hospitalist during your hospital stay. If you have any questions about your discharge medications or the care you received while you were in the hospital after you are discharged, you can call the unit and asked to speak with the hospitalist on call if the hospitalist that took care of you is not available. Once you are discharged, your primary care physician will handle any further medical issues. Please note that NO REFILLS for any discharge medications will be authorized once you are discharged, as it is imperative that you return to your primary care physician (or establish a relationship with a primary care physician if you do not have one) for your aftercare needs so that they can reassess your need for medications and monitor your lab values.   Consultations:  none    Procedures/Studies: CT ABDOMEN PELVIS WO CONTRAST  Result Date: 08/27/2020 CLINICAL DATA:  Diarrhea for 2 days, nausea and vomiting, emesis, pain EXAM: CT ABDOMEN AND  PELVIS WITHOUT CONTRAST TECHNIQUE: Multidetector CT imaging of the abdomen and pelvis was performed following the standard protocol without IV contrast. COMPARISON:  None. FINDINGS: Lower chest: No acute pleural or parenchymal lung disease. Hepatobiliary: Unenhanced imaging of the liver demonstrates no gross abnormalities. No biliary duct dilation. The gallbladder is unremarkable. Pancreas: Unremarkable. No pancreatic ductal dilatation or surrounding inflammatory changes. Spleen: Normal in size without focal abnormality. Adrenals/Urinary Tract: No evidence of urinary tract calculi. There is mild distension of the bilateral renal collecting systems and ureters, likely due to significant bladder distension. Bladder distension is likely due to chronic bladder outlet obstruction given a markedly enlarged and heterogeneous prostate. Significant bilateral perirenal fat stranding also felt to be the sequela of chronic bladder outlet obstruction. The adrenals are grossly normal. Stomach/Bowel: No bowel obstruction or ileus. Normal appendix right lower quadrant. No bowel  wall thickening or inflammatory change. Vascular/Lymphatic: No significant vascular findings on this unenhanced exam. No pathologic adenopathy within the abdomen or pelvis. Reproductive: The prostate is heterogeneous and enlarged, measuring approximately 6.3 x 7.4 x 6.3 cm. Other: There is trace free fluid within the pelvis. No free intraperitoneal gas. No abdominal wall hernia. Musculoskeletal: No acute or destructive bony lesions. Reconstructed images demonstrate no additional findings. IMPRESSION: 1. Markedly enlarged prostate, with evidence of chronic bladder outlet obstruction. 2. Trace pelvic free fluid, nonspecific. 3. No evidence of bowel obstruction or ileus. No CT findings to explain the patient's diarrhea and nausea/vomiting. Electronically Signed   By: Randa Ngo M.D.   On: 08/27/2020 20:30   US RENAL  Result Date: 08/28/2020 CLINICAL  DATA:  Acute kidney injury EXAM: RENAL / URINARY TRACT ULTRASOUND COMPLETE COMPARISON:  08/27/2020 CT abdomen pelvis FINDINGS: Right Kidney: Renal measurements: 12.1 x 5.5 x 5.9 cm = volume: 204.4 mL. Echogenicity within normal limits. No mass or hydronephrosis visualized. Left Kidney: Renal measurements: 11.3 x 5.3 x 5.4 cm = volume: 170.4 mL. Echogenicity within normal limits. No mass or hydronephrosis visualized. Bladder: Appears normal for degree of bladder distention. Small amount of debris in the bladder. Foley catheter in the bladder. Other: None. IMPRESSION: 1. Normal renal ultrasound. Electronically Signed   By: Kathreen Devoid   On: 08/28/2020 15:24       Subjective: Pt feels well today.  Could not void without Foley.  Not eager about going home or returning to work with it.  Discussed close OP urology follow up.  He has no acute complaints. No N/V/D.   Discharge Exam: Vitals:   08/30/20 0450 08/30/20 1351  BP: (!) 148/91 (!) 136/96  Pulse: 62 64  Resp: 18 18  Temp: 98.4 F (36.9 C) 98.4 F (36.9 C)  SpO2: 91% 100%   Vitals:   08/29/20 1338 08/29/20 2010 08/30/20 0450 08/30/20 1351  BP: 132/90 (!) 157/96 (!) 148/91 (!) 136/96  Pulse: 65 70 62 64  Resp: 18 18 18 18   Temp: 98.4 F (36.9 C) 98.5 F (36.9 C) 98.4 F (36.9 C) 98.4 F (36.9 C)  TempSrc:   Oral Oral  SpO2: 99% 100% 91% 100%    General: Pt is alert, awake, not in acute distress Cardiovascular: RRR, S1/S2 +, no rubs, no gallops Respiratory: CTA bilaterally, no wheezing, no rhonchi Abdominal: Soft, NT, ND, bowel sounds + Extremities: no edema, no cyanosis    The results of significant diagnostics from this hospitalization (including imaging, microbiology, ancillary and laboratory) are listed below for reference.     Microbiology: Recent Results (from the past 240 hour(s))  Resp Panel by RT-PCR (Flu A&B, Covid) Nasopharyngeal Swab     Status: None   Collection Time: 08/27/20  8:33 PM   Specimen:  Nasopharyngeal Swab; Nasopharyngeal(NP) swabs in vial transport medium  Result Value Ref Range Status   SARS Coronavirus 2 by RT PCR NEGATIVE NEGATIVE Final    Comment: (NOTE) SARS-CoV-2 target nucleic acids are NOT DETECTED.  The SARS-CoV-2 RNA is generally detectable in upper respiratory specimens during the acute phase of infection. The lowest concentration of SARS-CoV-2 viral copies this assay can detect is 138 copies/mL. A negative result does not preclude SARS-Cov-2 infection and should not be used as the sole basis for treatment or other patient management decisions. A negative result may occur with  improper specimen collection/handling, submission of specimen other than nasopharyngeal swab, presence of viral mutation(s) within the areas targeted by this assay,  and inadequate number of viral copies(<138 copies/mL). A negative result must be combined with clinical observations, patient history, and epidemiological information. The expected result is Negative.  Fact Sheet for Patients:  EntrepreneurPulse.com.au  Fact Sheet for Healthcare Providers:  IncredibleEmployment.be  This test is no t yet approved or cleared by the [place] FDA and  has been authorized for detection and/or diagnosis of SARS-CoV-2 by FDA under an Emergency Use Authorization (EUA). This EUA will remain  in effect (meaning this test can be used) for the duration of the COVID-19 declaration under Section 564(b)(1) of the Act, 21 U.S.C.section 360bbb-3(b)(1), unless the authorization is terminated  or revoked sooner.       Influenza A by PCR NEGATIVE NEGATIVE Final   Influenza B by PCR NEGATIVE NEGATIVE Final    Comment: (NOTE) The Xpert Xpress SARS-CoV-2/FLU/RSV plus assay is intended as an aid in the diagnosis of influenza from Nasopharyngeal swab specimens and should not be used as a sole basis for treatment. Nasal washings and aspirates are unacceptable for  Xpert Xpress SARS-CoV-2/FLU/RSV testing.  Fact Sheet for Patients: EntrepreneurPulse.com.au  Fact Sheet for Healthcare Providers: IncredibleEmployment.be  This test is not yet approved or cleared by the [place] FDA and has been authorized for detection and/or diagnosis of SARS-CoV-2 by FDA under an Emergency Use Authorization (EUA). This EUA will remain in effect (meaning this test can be used) for the duration of the COVID-19 declaration under Section 564(b)(1) of the Act, 21 U.S.C. section 360bbb-3(b)(1), unless the authorization is terminated or revoked.  Performed at [facility], [address] [place], [place] [address]   Culture, Urine     Status: None   Collection Time: 08/28/20  2:24 AM   Specimen: Urine, Catheterized  Result Value Ref Range Status   Specimen Description   Final    URINE, CATHETERIZED Performed at [place] [address], [place], [place] [address]    Special Requests   Final    NONE Performed at [facility], [place] [address] [place], [place] [address]    Culture   Final    NO GROWTH Performed at [facility], [place] [address], [place], [place] [address]    Report Status 08/29/2020 FINAL  Final     Labs: BNP (last 3 results) No results for input(s): BNP in the last 8760 hours. Basic Metabolic Panel: Recent Labs  Lab 08/27/20 1851 08/28/20 0052 08/28/20 0312 08/29/20 0249 08/30/20 0309  NA 133* 142 142 139 140  K 3.1* 3.5 3.4* 3.5 3.2*  CL 103 111 111 107 106  CO2 19* 25 24 27 28   GLUCOSE 120* 106* 121* 104* 122*  BUN 20 19 19 18 12   CREATININE 3.41* 2.93* 2.64* 1.65* 1.30*  CALCIUM 8.6* 8.8* 8.6* 8.2* 8.4*  MG 2.1  --  2.3  --   --   PHOS  --   --  3.6  --   --    Liver Function Tests: Recent Labs  Lab 08/27/20 1851 08/28/20 0312  AST 44* 31  ALT 39 33  ALKPHOS 59 51  BILITOT 1.4* 1.0  PROT 6.9 6.3*  ALBUMIN 3.8 3.4*    Recent Labs  Lab 08/27/20 1851  LIPASE 29   No results for input(s): AMMONIA in the last 168 hours. CBC: Recent Labs  Lab 08/27/20 1851 08/28/20 0312 08/29/20 0249  WBC 14.9* 10.8* 9.9  NEUTROABS 12.7* 8.4*  --   HGB 12.6* 11.8*  11.2*  HCT 39.4 38.5* 37.0*  MCV 69.7* 70.8* 72.1*  PLT 257 250 222   Cardiac Enzymes: Recent Labs  Lab 08/28/20 0052  CKTOTAL 727*   BNP: Invalid input(s): POCBNP CBG: No results for input(s): GLUCAP in the last 168 hours. D-Dimer No results for input(s): DDIMER in the last 72 hours. Hgb A1c No results for input(s): HGBA1C in the last 72 hours. Lipid Profile No results for input(s): CHOL, HDL, LDLCALC, TRIG, CHOLHDL, LDLDIRECT in the last 72 hours. Thyroid function studies Recent Labs    08/28/20 0312  TSH 0.456   Anemia work up No results for input(s): VITAMINB12, FOLATE, FERRITIN, TIBC, IRON, RETICCTPCT in the last 72 hours. Urinalysis    Component Value Date/Time   COLORURINE YELLOW 08/27/2020 2130   APPEARANCEUR CLEAR 08/27/2020 2130   LABSPEC <1.005 (L) 08/27/2020 2130   PHURINE 6.0 08/27/2020 2130   GLUCOSEU NEGATIVE 08/27/2020 2130   HGBUR LARGE (A) 08/27/2020 2130   BILIRUBINUR NEGATIVE 08/27/2020 2130   KETONESUR 15 (A) 08/27/2020 2130   PROTEINUR NEGATIVE 08/27/2020 2130   NITRITE NEGATIVE 08/27/2020 2130   LEUKOCYTESUR NEGATIVE 08/27/2020 2130   Sepsis Labs Invalid input(s): PROCALCITONIN,  WBC,  LACTICIDVEN Microbiology Recent Results (from the past 240 hour(s))  Resp Panel by RT-PCR (Flu A&B, Covid) Nasopharyngeal Swab     Status: None   Collection Time: 08/27/20  8:33 PM   Specimen: Nasopharyngeal Swab; Nasopharyngeal(NP) swabs in vial transport medium  Result Value Ref Range Status   SARS Coronavirus 2 by RT PCR NEGATIVE NEGATIVE Final    Comment: (NOTE) SARS-CoV-2 target nucleic acids are NOT DETECTED.  The SARS-CoV-2 RNA is generally detectable in upper respiratory specimens during the acute phase of  infection. The lowest concentration of SARS-CoV-2 viral copies this assay can detect is 138 copies/mL. A negative result does not preclude SARS-Cov-2 infection and should not be used as the sole basis for treatment or other patient management decisions. A negative result may occur with  improper specimen collection/handling, submission of specimen other than nasopharyngeal swab, presence of viral mutation(s) within the areas targeted by this assay, and inadequate number of viral copies(<138 copies/mL). A negative result must be combined with clinical observations, patient history, and epidemiological information. The expected result is Negative.  Fact Sheet for Patients:  EntrepreneurPulse.com.au  Fact Sheet for Healthcare Providers:  IncredibleEmployment.be  This test is no t yet approved or cleared by the [place] FDA and  has been authorized for detection and/or diagnosis of SARS-CoV-2 by FDA under an Emergency Use Authorization (EUA). This EUA will remain  in effect (meaning this test can be used) for the duration of the COVID-19 declaration under Section 564(b)(1) of the Act, 21 U.S.C.section 360bbb-3(b)(1), unless the authorization is terminated  or revoked sooner.       Influenza A by PCR NEGATIVE NEGATIVE Final   Influenza B by PCR NEGATIVE NEGATIVE Final    Comment: (NOTE) The Xpert Xpress SARS-CoV-2/FLU/RSV plus assay is intended as an aid in the diagnosis of influenza from Nasopharyngeal swab specimens and should not be used as a sole basis for treatment. Nasal washings and aspirates are unacceptable for Xpert Xpress SARS-CoV-2/FLU/RSV testing.  Fact Sheet for Patients: EntrepreneurPulse.com.au  Fact Sheet for Healthcare Providers: IncredibleEmployment.be  This test is not yet approved or cleared by the [place] FDA and has been authorized for detection and/or diagnosis of SARS-CoV-2  by FDA under an Emergency Use Authorization (EUA). This EUA will remain in effect (meaning this test can  be used) for the duration of the COVID-19 declaration under Section 564(b)(1) of the Act, 21 U.S.C. section 360bbb-3(b)(1), unless the authorization is terminated or revoked.  Performed at [facility], [address] [place], [place] [address]   Culture, Urine     Status: None   Collection Time: 08/28/20  2:24 AM   Specimen: Urine, Catheterized  Result Value Ref Range Status   Specimen Description   Final    URINE, CATHETERIZED Performed at [place] [address], [place], [place] [address]    Special Requests   Final    NONE Performed at [facility], [place] [address] [place], [place] [address]    Culture   Final    NO GROWTH Performed at [facility], [place] [address] [place], [place] [address]    Report Status 08/29/2020 FINAL  Final     Time coordinating discharge: Over 30 minutes  SIGNED:   Ezekiel Slocumb, DO [facility] 08/30/2020, 6:22 PM   If 7PM-7AM, please contact night-coverage [email or web address]

## 2020-08-30 NOTE — Progress Notes (Signed)
Discharge instructions for foley, foley care and leg bag given to patient and wife, discharge papers with instructions and number for Dr winter urologist included.  Questions asked and answered. Bethann Punches RN

## 2020-09-13 ENCOUNTER — Inpatient Hospital Stay (HOSPITAL_COMMUNITY)
Admission: EM | Admit: 2020-09-13 | Discharge: 2020-09-16 | DRG: 698 | Disposition: A | Discharge status: Home / Self Care | Payer: BC Managed Care – PPO | Attending: Student | Admitting: Student

## 2020-09-13 ENCOUNTER — Encounter (HOSPITAL_COMMUNITY): Payer: Self-pay

## 2020-09-13 ENCOUNTER — Emergency Department (HOSPITAL_COMMUNITY): Payer: BC Managed Care – PPO

## 2020-09-13 DIAGNOSIS — T83511A Infection and inflammatory reaction due to indwelling urethral catheter, initial encounter: Secondary | ICD-10-CM | POA: Diagnosis present

## 2020-09-13 DIAGNOSIS — A419 Sepsis, unspecified organism: Secondary | ICD-10-CM | POA: Diagnosis present

## 2020-09-13 DIAGNOSIS — Z8546 Personal history of malignant neoplasm of prostate: Secondary | ICD-10-CM

## 2020-09-13 DIAGNOSIS — T83518A Infection and inflammatory reaction due to other urinary catheter, initial encounter: Principal | ICD-10-CM | POA: Diagnosis present

## 2020-09-13 DIAGNOSIS — R310 Gross hematuria: Secondary | ICD-10-CM | POA: Diagnosis present

## 2020-09-13 DIAGNOSIS — C61 Malignant neoplasm of prostate: Secondary | ICD-10-CM | POA: Diagnosis present

## 2020-09-13 DIAGNOSIS — Z8249 Family history of ischemic heart disease and other diseases of the circulatory system: Secondary | ICD-10-CM

## 2020-09-13 DIAGNOSIS — B965 Pseudomonas (aeruginosa) (mallei) (pseudomallei) as the cause of diseases classified elsewhere: Secondary | ICD-10-CM | POA: Diagnosis present

## 2020-09-13 DIAGNOSIS — R338 Other retention of urine: Secondary | ICD-10-CM | POA: Diagnosis present

## 2020-09-13 DIAGNOSIS — Z20822 Contact with and (suspected) exposure to covid-19: Secondary | ICD-10-CM | POA: Diagnosis present

## 2020-09-13 DIAGNOSIS — Z79899 Other long term (current) drug therapy: Secondary | ICD-10-CM

## 2020-09-13 DIAGNOSIS — N179 Acute kidney failure, unspecified: Secondary | ICD-10-CM | POA: Diagnosis present

## 2020-09-13 DIAGNOSIS — R339 Retention of urine, unspecified: Secondary | ICD-10-CM | POA: Diagnosis not present

## 2020-09-13 DIAGNOSIS — I10 Essential (primary) hypertension: Secondary | ICD-10-CM | POA: Diagnosis present

## 2020-09-13 DIAGNOSIS — Y846 Urinary catheterization as the cause of abnormal reaction of the patient, or of later complication, without mention of misadventure at the time of the procedure: Secondary | ICD-10-CM | POA: Diagnosis present

## 2020-09-13 DIAGNOSIS — N390 Urinary tract infection, site not specified: Secondary | ICD-10-CM | POA: Diagnosis present

## 2020-09-13 LAB — CBC WITH DIFFERENTIAL/PLATELET
Abs Immature Granulocytes: 0.04 10*3/uL (ref 0.00–0.07)
Basophils Absolute: 0 10*3/uL (ref 0.0–0.1)
Basophils Relative: 0 %
Eosinophils Absolute: 0.1 10*3/uL (ref 0.0–0.5)
Eosinophils Relative: 1 %
HCT: 38.9 % — ABNORMAL LOW (ref 39.0–52.0)
Hemoglobin: 11.7 g/dL — ABNORMAL LOW (ref 13.0–17.0)
Immature Granulocytes: 0 %
Lymphocytes Relative: 5 %
Lymphs Abs: 0.7 10*3/uL (ref 0.7–4.0)
MCH: 22 pg — ABNORMAL LOW (ref 26.0–34.0)
MCHC: 30.1 g/dL (ref 30.0–36.0)
MCV: 73.3 fL — ABNORMAL LOW (ref 80.0–100.0)
Monocytes Absolute: 0.5 10*3/uL (ref 0.1–1.0)
Monocytes Relative: 4 %
Neutro Abs: 12.2 10*3/uL — ABNORMAL HIGH (ref 1.7–7.7)
Neutrophils Relative %: 90 %
Platelets: 318 10*3/uL (ref 150–400)
RBC: 5.31 MIL/uL (ref 4.22–5.81)
RDW: 17.2 % — ABNORMAL HIGH (ref 11.5–15.5)
WBC: 13.6 10*3/uL — ABNORMAL HIGH (ref 4.0–10.5)
nRBC: 0 % (ref 0.0–0.2)

## 2020-09-13 MED ORDER — PIPERACILLIN-TAZOBACTAM 3.375 G IVPB 30 MIN
3.3750 g | Freq: Once | INTRAVENOUS | Status: AC
  Administered 2020-09-13: 3.375 g via INTRAVENOUS
  Filled 2020-09-13: qty 50

## 2020-09-13 MED ORDER — SODIUM CHLORIDE 0.9 % IV BOLUS
1000.0000 mL | Freq: Once | INTRAVENOUS | Status: AC
  Administered 2020-09-13: 1000 mL via INTRAVENOUS

## 2020-09-13 NOTE — ED Notes (Signed)
Noticed slight bleeding around tip of penis.

## 2020-09-13 NOTE — ED Provider Notes (Signed)
[facility] DEPT Provider Note   CSN: [id number] Arrival date & time: 09/13/20  2249     History Chief Complaint  Patient presents with   Urinary Retention    Patrick Everett is a 65 y.o. male.  Patient is a 65 year old male with history of enlarged prostate with recent admission for urinary retention and acute kidney injury.  Patient has had a Foley catheter in place for the past 2 weeks.  This was removed today at the urologist office.  Since returning home, he has been unable to void.  He describes abdominal fullness and suprapubic discomfort.  Patient also describes feeling chilled and arrives here with a temperature of 103.1.  He denies any other symptoms that would explain the fever.  He has tried to self cath at home as he was sent home with the equipment to do so, however this was unsuccessful.  The history is provided by the patient.      Past Medical History:  Diagnosis Date   Hypertension    Prostate cancer [facility])     Patient Active Problem List   Diagnosis Date Noted   AKI (acute kidney injury) ([place]) 08/28/2020   Bacteriuria 08/28/2020   Dehydration 08/28/2020   Hypokalemia 08/28/2020   Acute urinary retention 08/28/2020   Nausea and vomiting 08/28/2020   Nausea & vomiting 08/27/2020   Malignant neoplasm of prostate ([place]) 12/07/2019    Past Surgical History:  Procedure Laterality Date   PROSTATE BIOPSY     WISDOM TOOTH EXTRACTION     XI ROBOTIC ASSISTED INGUINAL HERNIA REPAIR WITH MESH         Family History  Problem Relation Age of Onset   CAD Mother    Breast cancer Neg Hx    Colon cancer Neg Hx    Pancreatic cancer Neg Hx    Prostate cancer Neg Hx     Social History   Tobacco Use   Smoking status: Never   Smokeless tobacco: Never  Vaping Use   Vaping Use: Never used  Substance Use Topics   Alcohol use: Yes    Comment: on occasion    Drug use: Never    Home Medications Prior to Admission medications    Medication Sig Start Date End Date Taking? Authorizing Provider  amLODipine (NORVASC) 10 MG tablet Take 1 tablet by mouth daily. 06/07/20   [provider]  tamsulosin (FLOMAX) 0.4 MG CAPS capsule Take 0.4 mg by mouth daily. 10/25/19   [provider]    Allergies    Patient has no known allergies.  Review of Systems   Review of Systems  All other systems reviewed and are negative.  Physical Exam Updated Vital Signs BP (!) 153/97 (BP Location: Left Arm)   Pulse (!) 112   Temp (!) 103.1 F (39.5 C) (Oral)   Resp 16   Ht 5\' 9"  (1.753 m)   Wt 81.2 kg   SpO2 98%   BMI 26.43 kg/m   Physical Exam Vitals and nursing note reviewed.  Constitutional:      General: He is not in acute distress.    Appearance: He is well-developed. He is not diaphoretic.  HENT:     Head: Normocephalic and atraumatic.  Cardiovascular:     Rate and Rhythm: Normal rate and regular rhythm.     Heart sounds: No murmur heard.   No friction rub.  Pulmonary:     Effort: Pulmonary effort is normal. No respiratory distress.  Breath sounds: Normal breath sounds. No wheezing or rales.  Abdominal:     General: Bowel sounds are normal. There is no distension.     Palpations: Abdomen is soft.     Tenderness: There is abdominal tenderness. There is no right CVA tenderness, left CVA tenderness, guarding or rebound.     Comments: There is mild suprapubic tenderness  Musculoskeletal:        General: Normal range of motion.     Cervical back: Normal range of motion and neck supple.  Skin:    General: Skin is warm and dry.  Neurological:     Mental Status: He is alert and oriented to person, place, and time.     Coordination: Coordination normal.    ED Results / Procedures / Treatments   Labs (all labs ordered are listed, but only abnormal results are displayed) Labs Reviewed  URINE CULTURE  CULTURE, BLOOD (ROUTINE X 2)  CULTURE, BLOOD (ROUTINE X 2)  RESP PANEL BY RT-PCR (FLU A&B,  COVID) ARPGX2  LACTIC ACID, PLASMA  LACTIC ACID, PLASMA  COMPREHENSIVE METABOLIC PANEL  CBC WITH DIFFERENTIAL/PLATELET  PROTIME-INR  APTT  URINALYSIS, ROUTINE W REFLEX MICROSCOPIC    EKG EKG Interpretation  Date/Time:  Wednesday September 13 2020 23:44:18 EDT Ventricular Rate:  106 PR Interval:  169 QRS Duration: 84 QT Interval:  319 QTC Calculation: 424 R Axis:   -21 Text Interpretation: Sinus tachycardia Borderline left axis deviation Abnormal R-wave progression, early transition Confirmed by Veryl Speak [phone]) on 09/13/2020 11:52:20 PM  Radiology No results found.  Procedures Procedures   Medications Ordered in ED Medications  piperacillin-tazobactam (ZOSYN) IVPB 3.375 g (has no administration in time range)    ED Course  I have reviewed the triage vital signs and the nursing notes.  Pertinent labs & imaging results that were available during my care of the patient were reviewed by me and considered in my medical decision making (see chart for details).    MDM Rules/Calculators/A&P  Patient with history of BPH and recent admission for urinary retention presenting with recurrent urinary retention after his Foley catheter was removed this morning.  Patient arrives here febrile with temp of 103.1 and tachycardic.  Septic work-up was initiated including blood cultures, chest x-ray, urinalysis and urine culture, and COVID testing.  Patient given Zosyn for presumed urinary source.  Patient hydrated with 1 L of normal saline and heart rate has improved somewhat.  Lactate has returned and is in the normal range.  Patient to be admitted to the hospitalist service under the care of Dr. Alcario Drought for further observation and following of cultures.  CRITICAL CARE Performed by: Veryl Speak Total critical care time: 40 minutes Critical care time was exclusive of separately billable procedures and treating other patients. Critical care was necessary to treat or prevent imminent or  life-threatening deterioration. Critical care was time spent personally by me on the following activities: development of treatment plan with patient and/or surrogate as well as nursing, discussions with consultants, evaluation of patient's response to treatment, examination of patient, obtaining history from patient or surrogate, ordering and performing treatments and interventions, ordering and review of laboratory studies, ordering and review of radiographic studies, pulse oximetry and re-evaluation of patient's condition.   Final Clinical Impression(s) / ED Diagnoses Final diagnoses:  None    Rx / DC Orders ED Discharge Orders     None        Veryl Speak, MD 09/14/20 [phone]

## 2020-09-13 NOTE — ED Provider Notes (Signed)
Emergency Medicine Provider Triage Evaluation Note  Patrick Everett , a 65 y.o. male  was evaluated in triage.  Pt complains of fevers and suprapubic pain.  History of prostate cancer.  Recently admitted with nausea, vomiting, diarrhea, and lower abdominal pain.  He had an AKI as well as urinary retention.  Foley was placed.  He states that the Foley was removed earlier today and he was sent home with equipment to self cath.  He states he was unable to do so.  He states today began developing a fever, chills, as well as suprapubic pain.  Physical Exam  BP (!) 153/97 (BP Location: Left Arm)   Pulse (!) 112   Temp (!) 103.1 F (39.5 C) (Oral)   Resp 16   Ht 5\' 9"  (1.753 m)   Wt 81.2 kg   SpO2 98%   BMI 26.43 kg/m  Gen:   Awake, ill appearing   Resp:  Normal effort  MSK:   Moves extremities without difficulty  Other:    Medical Decision Making  Medically screening exam initiated at 11:18 PM.  Appropriate orders placed.  Patrick Everett was informed that the remainder of the evaluation will be completed by another provider, this initial triage assessment does not replace that evaluation, and the importance of remaining in the ED until their evaluation is complete.     [name], PA-C 09/13/20 2321    Lajean Saver, MD 09/14/20 1651

## 2020-09-13 NOTE — ED Triage Notes (Signed)
Pt c/o urinary retention and pelvic pain starting today. States he had a foley removed this morning and attempted to self cath at home but was unable to. Reports fevers starting tonight as well

## 2020-09-14 ENCOUNTER — Other Ambulatory Visit: Payer: Self-pay

## 2020-09-14 DIAGNOSIS — Z8249 Family history of ischemic heart disease and other diseases of the circulatory system: Secondary | ICD-10-CM | POA: Diagnosis not present

## 2020-09-14 DIAGNOSIS — R338 Other retention of urine: Secondary | ICD-10-CM | POA: Diagnosis not present

## 2020-09-14 DIAGNOSIS — R310 Gross hematuria: Secondary | ICD-10-CM | POA: Diagnosis present

## 2020-09-14 DIAGNOSIS — B965 Pseudomonas (aeruginosa) (mallei) (pseudomallei) as the cause of diseases classified elsewhere: Secondary | ICD-10-CM | POA: Diagnosis present

## 2020-09-14 DIAGNOSIS — T83511A Infection and inflammatory reaction due to indwelling urethral catheter, initial encounter: Secondary | ICD-10-CM | POA: Diagnosis not present

## 2020-09-14 DIAGNOSIS — R339 Retention of urine, unspecified: Secondary | ICD-10-CM | POA: Diagnosis present

## 2020-09-14 DIAGNOSIS — A4150 Gram-negative sepsis, unspecified: Secondary | ICD-10-CM | POA: Diagnosis not present

## 2020-09-14 DIAGNOSIS — T83518A Infection and inflammatory reaction due to other urinary catheter, initial encounter: Secondary | ICD-10-CM | POA: Diagnosis present

## 2020-09-14 DIAGNOSIS — E876 Hypokalemia: Secondary | ICD-10-CM | POA: Diagnosis not present

## 2020-09-14 DIAGNOSIS — A419 Sepsis, unspecified organism: Secondary | ICD-10-CM | POA: Diagnosis present

## 2020-09-14 DIAGNOSIS — C61 Malignant neoplasm of prostate: Secondary | ICD-10-CM | POA: Diagnosis present

## 2020-09-14 DIAGNOSIS — Z20822 Contact with and (suspected) exposure to covid-19: Secondary | ICD-10-CM | POA: Diagnosis present

## 2020-09-14 DIAGNOSIS — Z79899 Other long term (current) drug therapy: Secondary | ICD-10-CM | POA: Diagnosis not present

## 2020-09-14 DIAGNOSIS — N390 Urinary tract infection, site not specified: Secondary | ICD-10-CM | POA: Diagnosis present

## 2020-09-14 DIAGNOSIS — N179 Acute kidney failure, unspecified: Secondary | ICD-10-CM | POA: Diagnosis present

## 2020-09-14 DIAGNOSIS — Z8546 Personal history of malignant neoplasm of prostate: Secondary | ICD-10-CM | POA: Diagnosis not present

## 2020-09-14 DIAGNOSIS — Y846 Urinary catheterization as the cause of abnormal reaction of the patient, or of later complication, without mention of misadventure at the time of the procedure: Secondary | ICD-10-CM | POA: Diagnosis present

## 2020-09-14 DIAGNOSIS — A4152 Sepsis due to Pseudomonas: Secondary | ICD-10-CM | POA: Diagnosis not present

## 2020-09-14 DIAGNOSIS — I10 Essential (primary) hypertension: Secondary | ICD-10-CM | POA: Diagnosis present

## 2020-09-14 LAB — APTT: aPTT: 28 seconds (ref 24–36)

## 2020-09-14 LAB — COMPREHENSIVE METABOLIC PANEL
ALT: 14 U/L (ref 0–44)
AST: 17 U/L (ref 15–41)
Albumin: 3.8 g/dL (ref 3.5–5.0)
Alkaline Phosphatase: 65 U/L (ref 38–126)
Anion gap: 10 (ref 5–15)
BUN: 16 mg/dL (ref 8–23)
CO2: 24 mmol/L (ref 22–32)
Calcium: 9.1 mg/dL (ref 8.9–10.3)
Chloride: 105 mmol/L (ref 98–111)
Creatinine, Ser: 1.38 mg/dL — ABNORMAL HIGH (ref 0.61–1.24)
GFR, Estimated: 57 mL/min — ABNORMAL LOW (ref >60)
Glucose, Bld: 126 mg/dL — ABNORMAL HIGH (ref 70–99)
Potassium: 3.3 mmol/L — ABNORMAL LOW (ref 3.5–5.1)
Sodium: 139 mmol/L (ref 135–145)
Total Bilirubin: 0.7 mg/dL (ref 0.3–1.2)
Total Protein: 7.2 g/dL (ref 6.5–8.1)

## 2020-09-14 LAB — CBC
HCT: 33.6 % — ABNORMAL LOW (ref 39.0–52.0)
Hemoglobin: 10.2 g/dL — ABNORMAL LOW (ref 13.0–17.0)
MCH: 22 pg — ABNORMAL LOW (ref 26.0–34.0)
MCHC: 30.4 g/dL (ref 30.0–36.0)
MCV: 72.6 fL — ABNORMAL LOW (ref 80.0–100.0)
Platelets: 278 10*3/uL (ref 150–400)
RBC: 4.63 MIL/uL (ref 4.22–5.81)
RDW: 16.6 % — ABNORMAL HIGH (ref 11.5–15.5)
WBC: 19.3 10*3/uL — ABNORMAL HIGH (ref 4.0–10.5)
nRBC: 0 % (ref 0.0–0.2)

## 2020-09-14 LAB — URINALYSIS, ROUTINE W REFLEX MICROSCOPIC
Bacteria, UA: NONE SEEN
RBC / HPF: >50 RBC/hpf — ABNORMAL HIGH (ref 0–5)
Specific Gravity, Urine: 1.013 (ref 1.005–1.030)

## 2020-09-14 LAB — LACTIC ACID, PLASMA: Lactic Acid, Venous: 1.8 mmol/L (ref 0.5–1.9)

## 2020-09-14 LAB — BASIC METABOLIC PANEL
Anion gap: 9 (ref 5–15)
BUN: 17 mg/dL (ref 8–23)
CO2: 21 mmol/L — ABNORMAL LOW (ref 22–32)
Calcium: 8.7 mg/dL — ABNORMAL LOW (ref 8.9–10.3)
Chloride: 108 mmol/L (ref 98–111)
Creatinine, Ser: 1.35 mg/dL — ABNORMAL HIGH (ref 0.61–1.24)
GFR, Estimated: 58 mL/min — ABNORMAL LOW (ref >60)
Glucose, Bld: 153 mg/dL — ABNORMAL HIGH (ref 70–99)
Potassium: 2.9 mmol/L — ABNORMAL LOW (ref 3.5–5.1)
Sodium: 138 mmol/L (ref 135–145)

## 2020-09-14 LAB — PROTIME-INR
INR: 1.1 (ref 0.8–1.2)
Prothrombin Time: 13.8 seconds (ref 11.4–15.2)

## 2020-09-14 LAB — RESP PANEL BY RT-PCR (FLU A&B, COVID) ARPGX2
Influenza A by PCR: NEGATIVE
Influenza B by PCR: NEGATIVE
SARS Coronavirus 2 by RT PCR: NEGATIVE

## 2020-09-14 MED ORDER — ACETAMINOPHEN 500 MG PO TABS
1000.0000 mg | ORAL_TABLET | Freq: Once | ORAL | Status: AC
  Administered 2020-09-14: 1000 mg via ORAL
  Filled 2020-09-14: qty 2

## 2020-09-14 MED ORDER — ONDANSETRON HCL 4 MG PO TABS: 4.0000 mg | ORAL_TABLET | Freq: Four times a day (QID) | ORAL | Status: DC | PRN

## 2020-09-14 MED ORDER — ACETAMINOPHEN 650 MG RE SUPP: 650.0000 mg | Freq: Four times a day (QID) | RECTAL | Status: DC | PRN

## 2020-09-14 MED ORDER — TAMSULOSIN HCL 0.4 MG PO CAPS
0.4000 mg | ORAL_CAPSULE | Freq: Every day | ORAL | Status: DC
  Administered 2020-09-14 – 2020-09-16 (×3): 0.4 mg via ORAL
  Filled 2020-09-14 (×3): qty 1

## 2020-09-14 MED ORDER — ACETAMINOPHEN 325 MG PO TABS
650.0000 mg | ORAL_TABLET | Freq: Four times a day (QID) | ORAL | Status: DC | PRN
  Administered 2020-09-14 – 2020-09-15 (×2): 650 mg via ORAL
  Filled 2020-09-14 (×2): qty 2

## 2020-09-14 MED ORDER — CHLORHEXIDINE GLUCONATE CLOTH 2 % EX PADS
6.0000 | MEDICATED_PAD | Freq: Every day | CUTANEOUS | Status: DC
  Administered 2020-09-14 – 2020-09-16 (×3): 6 via TOPICAL

## 2020-09-14 MED ORDER — LACTATED RINGERS IV SOLN: INTRAVENOUS | Status: DC

## 2020-09-14 MED ORDER — KETOROLAC TROMETHAMINE 30 MG/ML IJ SOLN
30.0000 mg | Freq: Once | INTRAMUSCULAR | Status: AC
  Administered 2020-09-14: 30 mg via INTRAVENOUS
  Filled 2020-09-14: qty 1

## 2020-09-14 MED ORDER — SODIUM CHLORIDE 0.9 % IV SOLN
2.0000 g | Freq: Two times a day (BID) | INTRAVENOUS | Status: DC
  Administered 2020-09-14 – 2020-09-16 (×5): 2 g via INTRAVENOUS
  Filled 2020-09-14 (×5): qty 2

## 2020-09-14 MED ORDER — ONDANSETRON HCL 4 MG/2ML IJ SOLN: 4.0000 mg | Freq: Four times a day (QID) | INTRAMUSCULAR | Status: DC | PRN

## 2020-09-14 MED ORDER — POTASSIUM CHLORIDE CRYS ER 20 MEQ PO TBCR
20.0000 meq | EXTENDED_RELEASE_TABLET | Freq: Once | ORAL | Status: AC
  Administered 2020-09-14: 20 meq via ORAL
  Filled 2020-09-14: qty 1

## 2020-09-14 NOTE — ED Notes (Signed)
ED TO INPATIENT HANDOFF REPORT  Name/Age/Gender Patrick Everett 65 y.o. male  Code Status    Code Status Orders  (From admission, onward)         Start     Ordered   09/14/20 0220  Full code  Continuous        09/14/20 0220        Code Status History    Date Active Date Inactive Code Status Order ID Comments User Context   08/28/2020 0020 08/30/2020 2339 Full Code [id number]  Toy Baker, MD Inpatient      Home/SNF/Other Home  Chief Complaint Catheter-associated urinary tract infection ([place]) [T83.511A, N39.0]  Level of Care/Admitting Diagnosis ED Disposition    ED Disposition  Admit   Condition  --   Comment  Hospital Area: [facility] [[id number]]  Level of Care: Telemetry [5]  Admit to tele based on following criteria: Complex arrhythmia (Bradycardia/Tachycardia)  May place patient in observation at [facility] or [place] if equivalent level of care is available:: No  Covid Evaluation: Confirmed COVID Negative  Diagnosis: Catheter-associated urinary tract infection [facility]) [[id number]]  Admitting Physician: Doreatha Massed  Attending Physician: Etta Quill [phone]         Medical History Past Medical History:  Diagnosis Date  . Hypertension   . Prostate cancer ([place])     Allergies No Known Allergies  IV Location/Drains/Wounds Patient Lines/Drains/Airways Status    Active Line/Drains/Airways    Name Placement date Placement time Site Days   Peripheral IV 09/13/20 20 G 1" Left;Posterior Forearm 09/13/20  2339  Forearm  1   Peripheral IV 09/13/20 20 G 1" Right;Posterior Forearm 09/13/20  2350  Forearm  1   Urethral Catheter Vladimir Creeks, RN [place];Latex 16 Fr. 09/14/20  0106  [place];Latex  less than 1          Labs/Imaging Results for orders placed or performed during the hospital encounter of 09/13/20 (from the past 48 hour(s))  Lactic acid, plasma     Status: None   Collection Time: 09/13/20 11:43 PM  Result Value Ref Range    Lactic Acid, Venous 1.8 0.5 - 1.9 mmol/L    Comment: Performed at [facility], [place] [address] [place], [place] [address]  Comprehensive metabolic panel     Status: Abnormal   Collection Time: 09/13/20 11:43 PM  Result Value Ref Range   Sodium 139 135 - 145 mmol/L   Potassium 3.3 (L) 3.5 - 5.1 mmol/L   Chloride 105 98 - 111 mmol/L   CO2 24 22 - 32 mmol/L   Glucose, Bld 126 (H) 70 - 99 mg/dL    Comment: Glucose reference range applies only to samples taken after fasting for at least 8 hours.   BUN 16 8 - 23 mg/dL   Creatinine, Ser 1.38 (H) 0.61 - 1.24 mg/dL   Calcium 9.1 8.9 - 10.3 mg/dL   Total Protein 7.2 6.5 - 8.1 g/dL   Albumin 3.8 3.5 - 5.0 g/dL   AST 17 15 - 41 U/L   ALT 14 0 - 44 U/L   Alkaline Phosphatase 65 38 - 126 U/L   Total Bilirubin 0.7 0.3 - 1.2 mg/dL   GFR, Estimated 57 (L) >60 mL/min    Comment: (NOTE) Calculated using the CKD-EPI Creatinine Equation (2021)    Anion gap 10 5 - 15    Comment: Performed at [facility], [place] [address], [place], [place] [address]  CBC WITH  DIFFERENTIAL     Status: Abnormal   Collection Time: 09/13/20 11:43 PM  Result Value Ref Range   WBC 13.6 (H) 4.0 - 10.5 K/uL   RBC 5.31 4.22 - 5.81 MIL/uL   Hemoglobin 11.7 (L) 13.0 - 17.0 g/dL   HCT 38.9 (L) 39.0 - 52.0 %   MCV 73.3 (L) 80.0 - 100.0 fL   MCH 22.0 (L) 26.0 - 34.0 pg   MCHC 30.1 30.0 - 36.0 g/dL   RDW 17.2 (H) 11.5 - 15.5 %   Platelets 318 150 - 400 K/uL   nRBC 0.0 0.0 - 0.2 %   Neutrophils Relative % 90 %   Neutro Abs 12.2 (H) 1.7 - 7.7 K/uL   Lymphocytes Relative 5 %   Lymphs Abs 0.7 0.7 - 4.0 K/uL   Monocytes Relative 4 %   Monocytes Absolute 0.5 0.1 - 1.0 K/uL   Eosinophils Relative 1 %   Eosinophils Absolute 0.1 0.0 - 0.5 K/uL   Basophils Relative 0 %   Basophils Absolute 0.0 0.0 - 0.1 K/uL   Immature Granulocytes 0 %   Abs Immature Granulocytes 0.04 0.00 - 0.07 K/uL    Comment: Performed at [facility], [place] [address] [place], [place] [address]  Protime-INR     Status: None   Collection Time: 09/13/20 11:43 PM  Result Value Ref Range   Prothrombin Time 13.8 11.4 - 15.2 seconds   INR 1.1 0.8 - 1.2    Comment: (NOTE) INR goal varies based on device and disease states. Performed at [facility], [place] [address], [place], [place] [address]   APTT     Status: None   Collection Time: 09/13/20 11:43 PM  Result Value Ref Range   aPTT 28 24 - 36 seconds    Comment: Performed at [facility], [place] [address], [place], [place] [address]  Urinalysis, Routine w reflex microscopic Urine, Catheterized     Status: Abnormal   Collection Time: 09/13/20 11:43 PM  Result Value Ref Range   Color, Urine RED (A) YELLOW    Comment: BIOCHEMICALS MAY BE AFFECTED BY COLOR   APPearance CLOUDY (A) CLEAR   Specific Gravity, Urine 1.013 1.005 - 1.030   pH  5.0 - 8.0    TEST NOT REPORTED DUE TO COLOR INTERFERENCE OF URINE PIGMENT   Glucose, UA (A) NEGATIVE mg/dL    TEST NOT REPORTED DUE TO COLOR INTERFERENCE OF URINE PIGMENT   Hgb urine dipstick (A) NEGATIVE    TEST NOT REPORTED DUE TO COLOR INTERFERENCE OF URINE PIGMENT   Bilirubin Urine (A) NEGATIVE    TEST NOT REPORTED DUE TO COLOR INTERFERENCE OF URINE PIGMENT   Ketones, ur (A) NEGATIVE mg/dL    TEST NOT REPORTED DUE TO COLOR INTERFERENCE OF URINE PIGMENT   Protein, ur (A) NEGATIVE mg/dL    TEST NOT REPORTED DUE TO COLOR INTERFERENCE OF URINE PIGMENT   Nitrite (A) NEGATIVE    TEST NOT REPORTED DUE TO COLOR INTERFERENCE OF URINE PIGMENT   Leukocytes,Ua (A) NEGATIVE    TEST NOT REPORTED DUE TO COLOR INTERFERENCE OF URINE PIGMENT   RBC / HPF >50 (H) 0 - 5 RBC/hpf   WBC, UA 11-20 0 - 5 WBC/hpf   Bacteria, UA NONE SEEN NONE SEEN    Comment: Performed at [facility], [place] [address] [place], [place] [address]  Resp Panel by RT-PCR (Flu A&B, Covid) Nasopharyngeal Swab     Status: None  Collection Time: 09/13/20 11:43 PM   Specimen: Nasopharyngeal Swab; Nasopharyngeal(NP) swabs in vial transport medium  Result Value Ref Range   SARS Coronavirus 2 by RT PCR NEGATIVE NEGATIVE    Comment: (NOTE) SARS-CoV-2 target nucleic acids are NOT DETECTED.  The SARS-CoV-2 RNA is generally detectable in upper respiratory specimens during the acute phase of infection. The lowest concentration of SARS-CoV-2 viral copies this assay can detect is 138 copies/mL. A negative result does not preclude SARS-Cov-2 infection and should not be used as the sole basis for treatment or other patient management decisions. A negative result may occur with  improper specimen collection/handling, submission of specimen other than nasopharyngeal swab, presence of viral mutation(s) within the areas targeted by this assay, and inadequate number of viral copies(<138 copies/mL). A negative result must be combined with clinical observations, patient history, and epidemiological information. The expected result is Negative.  Fact Sheet for Patients:  EntrepreneurPulse.com.au  Fact Sheet for Healthcare Providers:  IncredibleEmployment.be  This test is no t yet approved or cleared by the [place] FDA and  has been authorized for detection and/or diagnosis of SARS-CoV-2 by FDA under an Emergency Use Authorization (EUA). This EUA will remain  in effect (meaning this test can be used) for the duration of the COVID-19 declaration under Section 564(b)(1) of the Act, 21 U.S.C.section 360bbb-3(b)(1), unless the authorization is terminated  or revoked sooner.       Influenza A by PCR NEGATIVE NEGATIVE   Influenza B by PCR NEGATIVE NEGATIVE    Comment: (NOTE) The Xpert Xpress SARS-CoV-2/FLU/RSV plus assay is intended as an aid in the diagnosis of influenza from Nasopharyngeal swab specimens and should not be used as a sole basis for treatment. Nasal washings  and aspirates are unacceptable for Xpert Xpress SARS-CoV-2/FLU/RSV testing.  Fact Sheet for Patients: EntrepreneurPulse.com.au  Fact Sheet for Healthcare Providers: IncredibleEmployment.be  This test is not yet approved or cleared by the [place] FDA and has been authorized for detection and/or diagnosis of SARS-CoV-2 by FDA under an Emergency Use Authorization (EUA). This EUA will remain in effect (meaning this test can be used) for the duration of the COVID-19 declaration under Section 564(b)(1) of the Act, 21 U.S.C. section 360bbb-3(b)(1), unless the authorization is terminated or revoked.  Performed at [facility], [place] [address] [place], [place] [address]    DG Chest Port 1 View  Result Date: 09/13/2020 CLINICAL DATA:  Possible sepsis EXAM: PORTABLE CHEST 1 VIEW COMPARISON:  None. FINDINGS: Right lung is grossly clear. Streaky and hazy atelectasis left base. No consolidation or effusion. Normal cardiomediastinal silhouette. No pneumothorax IMPRESSION: Streaky and hazy atelectasis at the left base Electronically Signed   By: Donavan Foil M.D.   On: 09/13/2020 23:31    Pending Labs Unresulted Labs (From admission, onward)    Start     Ordered   09/14/20 0500  CBC  Tomorrow morning,   R        09/14/20 0220   09/14/20 5784  Basic metabolic panel  Tomorrow morning,   R        09/14/20 0220   09/13/20 2314  Urine culture  (Undifferentiated presentation (screening labs and basic nursing orders))  ONCE - STAT,   STAT        09/13/20 2314   09/13/20 2314  Blood Culture (routine x 2)  (Undifferentiated presentation (screening labs and basic nursing orders))  BLOOD CULTURE X 2,   STAT      09/13/20 2314  Vitals/Pain Today's Vitals   09/14/20 0130 09/14/20 0145 09/14/20 0200 09/14/20 0228  BP: (!) 144/89 (!) 157/97 (!) 149/98   Pulse: (!) 110 (!) 109 (!) 110   Resp: (!) 30 (!) 30 (!) 29   Temp:    (!)  101.4 F (38.6 C)  TempSrc:    Oral  SpO2: 98% 99% 98%   Weight:      Height:      PainSc:        Isolation Precautions No active isolations  Medications Medications  lactated ringers infusion ( Intravenous New Bag/Given 09/14/20 0227)  acetaminophen (TYLENOL) tablet 650 mg (has no administration in time range)    Or  acetaminophen (TYLENOL) suppository 650 mg (has no administration in time range)  ondansetron (ZOFRAN) tablet 4 mg (has no administration in time range)    Or  ondansetron (ZOFRAN) injection 4 mg (has no administration in time range)  tamsulosin (FLOMAX) capsule 0.4 mg (has no administration in time range)  ketorolac (TORADOL) 30 MG/ML injection 30 mg (has no administration in time range)  piperacillin-tazobactam (ZOSYN) IVPB 3.375 g (0 g Intravenous Stopped 09/14/20 0104)  sodium chloride 0.9 % bolus 1,000 mL (0 mLs Intravenous Stopped 09/14/20 0104)  acetaminophen (TYLENOL) tablet 1,000 mg (1,000 mg Oral Given 09/14/20 0112)    Mobility walks

## 2020-09-14 NOTE — Progress Notes (Signed)
Patient ID: Patrick Everett, male   DOB: 03/26/1956, 65 y.o.   MRN: [id number] Patient seen and examined.  This is a no charge note as patient was admitted this AM.  H&P reviewed.  Patrick Everett is a 65 y.o. male with medical history significant of HTN.   Pt recently admitted 5/29-6/1 for AKI due to acute urinary retention.  Diagnosed with prostate CA.  AKI resolved with foley placement with improvement of creat from 3.x to 1.3 at time of discharge.  He failed voiding trial so foley was left in place.  Pt put on flomax.Pt followed up with urology today.  Feeling at baseline at the time of urology visit.  Foley removed.   Since foley removal earlier today pt developed fever to 103.x, pelvic pain and has been unable to urinate.  Was given self cath equipment at home but was unable to self cath.Pt returned to ER. Foley placed with 500 cc UOP. Was started on zosyn in ED for presumed UTI.  Has no complaints this am  Sleepy, nad Cta no r/w Regular s1/s2 no galoop Soft benign +bs No edema  A/P: Continue cefepime F/u bcx ivf

## 2020-09-14 NOTE — H&P (Signed)
History and Physical    Patrick Everett MPN:[id number] DOB: [birth year] DOA: 09/13/2020  PCP: Kristie Cowman, MD  Patient coming from: Home  I have personally briefly reviewed patient's old medical records in [place]  Chief Complaint: Urinary retention  HPI: Patrick Everett is a 65 y.o. male with medical history significant of HTN.  Pt recently admitted 5/29-6/1 for AKI due to acute urinary retention.  Diagnosed with prostate CA.  AKI resolved with foley placement with improvement of creat from 3.x to 1.3 at time of discharge.  He failed voiding trial so foley was left in place.  Pt put on flomax  Pt followed up with urology today.  Feeling at baseline at the time of urology visit.  Foley removed.  Since foley removal earlier today pt developed fever to 103.x, pelvic pain and has been unable to urinate.  Was given self cath equipment at home but was unable to self cath.  Pt returns to ED.  No cough, no SOB, no other symptoms that would explain fever.   ED Course: Tm 103.1.  foley placed with 500cc+ UOP.  Urine cloudy and red.  WBC 13.6k.  COVID neg  Creat 1.38 (1.3 on discharge).  Pt started on zosyn in ED for presumed CAUTI.   Review of Systems: As per HPI, otherwise all review of systems negative.  Past Medical History:  Diagnosis Date   Hypertension    Prostate cancer [facility])     Past Surgical History:  Procedure Laterality Date   PROSTATE BIOPSY     WISDOM TOOTH EXTRACTION     XI ROBOTIC ASSISTED INGUINAL HERNIA REPAIR WITH MESH       reports that he has never smoked. He has never used smokeless tobacco. He reports current alcohol use. He reports that he does not use drugs.  No Known Allergies  Family History  Problem Relation Age of Onset   CAD Mother    Breast cancer Neg Hx    Colon cancer Neg Hx    Pancreatic cancer Neg Hx    Prostate cancer Neg Hx      Prior to Admission medications   Medication Sig Start Date End Date Taking? Authorizing  Provider  amLODipine (NORVASC) 10 MG tablet Take 1 tablet by mouth daily. 06/07/20  Yes [provider]  tamsulosin (FLOMAX) 0.4 MG CAPS capsule Take 0.4 mg by mouth daily. 10/25/19  Yes [provider]  Vitamin D, Ergocalciferol, (DRISDOL) 1.25 MG (50000 UNIT) CAPS capsule Take 50,000 Units by mouth every 7 (seven) days. Saturday   Yes [provider]    Physical Exam: Vitals:   09/14/20 0130 09/14/20 0145 09/14/20 0200 09/14/20 0228  BP: (!) 144/89 (!) 157/97 (!) 149/98   Pulse: (!) 110 (!) 109 (!) 110   Resp: (!) 30 (!) 30 (!) 29   Temp:    (!) 101.4 F (38.6 C)  TempSrc:    Oral  SpO2: 98% 99% 98%   Weight:      Height:        Constitutional: NAD, calm, comfortable Eyes: PERRL, lids and conjunctivae normal ENMT: Mucous membranes are moist. Posterior pharynx clear of any exudate or lesions.Normal dentition.  Neck: normal, supple, no masses, no thyromegaly Respiratory: clear to auscultation bilaterally, no wheezing, no crackles. Normal respiratory effort. No accessory muscle use.  Cardiovascular: Tachycardic Abdomen: no tenderness, no masses palpated. No hepatosplenomegaly. Bowel sounds positive.  Musculoskeletal: no clubbing / cyanosis. No joint deformity upper and lower extremities. Good ROM, no  contractures. Normal muscle tone.  Skin: no rashes, lesions, ulcers. No induration Neurologic: CN 2-12 grossly intact. Sensation intact, DTR normal. Strength 5/5 in all 4.  Psychiatric: Normal judgment and insight. Alert and oriented x 3. Normal mood.    Labs on Admission: I have personally reviewed following labs and imaging studies  CBC: Recent Labs  Lab 09/13/20 2343  WBC 13.6*  NEUTROABS 12.2*  HGB 11.7*  HCT 38.9*  MCV 73.3*  PLT 878   Basic Metabolic Panel: Recent Labs  Lab 09/13/20 2343  NA 139  K 3.3*  CL 105  CO2 24  GLUCOSE 126*  BUN 16  CREATININE 1.38*  CALCIUM 9.1   GFR: Estimated Creatinine Clearance: 53.4 mL/min (A) (by C-G  formula based on SCr of 1.38 mg/dL (H)). Liver Function Tests: Recent Labs  Lab 09/13/20 2343  AST 17  ALT 14  ALKPHOS 65  BILITOT 0.7  PROT 7.2  ALBUMIN 3.8   No results for input(s): LIPASE, AMYLASE in the last 168 hours. No results for input(s): AMMONIA in the last 168 hours. Coagulation Profile: Recent Labs  Lab 09/13/20 2343  INR 1.1   Cardiac Enzymes: No results for input(s): CKTOTAL, CKMB, CKMBINDEX, TROPONINI in the last 168 hours. BNP (last 3 results) No results for input(s): PROBNP in the last 8760 hours. HbA1C: No results for input(s): HGBA1C in the last 72 hours. CBG: No results for input(s): GLUCAP in the last 168 hours. Lipid Profile: No results for input(s): CHOL, HDL, LDLCALC, TRIG, CHOLHDL, LDLDIRECT in the last 72 hours. Thyroid Function Tests: No results for input(s): TSH, T4TOTAL, FREET4, T3FREE, THYROIDAB in the last 72 hours. Anemia Panel: No results for input(s): VITAMINB12, FOLATE, FERRITIN, TIBC, IRON, RETICCTPCT in the last 72 hours. Urine analysis:    Component Value Date/Time   COLORURINE RED (A) 09/13/2020 2343   APPEARANCEUR CLOUDY (A) 09/13/2020 2343   LABSPEC 1.013 09/13/2020 2343   PHURINE  09/13/2020 2343    TEST NOT REPORTED DUE TO COLOR INTERFERENCE OF URINE PIGMENT   GLUCOSEU (A) 09/13/2020 2343    TEST NOT REPORTED DUE TO COLOR INTERFERENCE OF URINE PIGMENT   HGBUR (A) 09/13/2020 2343    TEST NOT REPORTED DUE TO COLOR INTERFERENCE OF URINE PIGMENT   BILIRUBINUR (A) 09/13/2020 2343    TEST NOT REPORTED DUE TO COLOR INTERFERENCE OF URINE PIGMENT   KETONESUR (A) 09/13/2020 2343    TEST NOT REPORTED DUE TO COLOR INTERFERENCE OF URINE PIGMENT   PROTEINUR (A) 09/13/2020 2343    TEST NOT REPORTED DUE TO COLOR INTERFERENCE OF URINE PIGMENT   NITRITE (A) 09/13/2020 2343    TEST NOT REPORTED DUE TO COLOR INTERFERENCE OF URINE PIGMENT   LEUKOCYTESUR (A) 09/13/2020 2343    TEST NOT REPORTED DUE TO COLOR INTERFERENCE OF URINE PIGMENT     Radiological Exams on Admission: DG Chest Port 1 View  Result Date: 09/13/2020 CLINICAL DATA:  Possible sepsis EXAM: PORTABLE CHEST 1 VIEW COMPARISON:  None. FINDINGS: Right lung is grossly clear. Streaky and hazy atelectasis left base. No consolidation or effusion. Normal cardiomediastinal silhouette. No pneumothorax IMPRESSION: Streaky and hazy atelectasis at the left base Electronically Signed   By: Donavan Foil M.D.   On: 09/13/2020 23:31    EKG: Independently reviewed.  Assessment/Plan Principal Problem:   Catheter-associated urinary tract infection (HCC) Active Problems:   Malignant neoplasm of prostate (HCC)   Acute urinary retention    CAUTI - Empiric cefepime Culture pending IVF: LR at 100 Tele monitor  for tachycardia Tylenol PRN fever Giving single dose of toradol for refractory fever Repeat CBC/BMP in AM Urinary retention - Ongoing, failed voiding trial again Cont flomax HTN - Holding amlodipine for the moment Resume tomorrow if pt improves from a CAUTI perspective. Prostate CA - Following with urology  DVT prophylaxis: SCDs - gross hematuria Code Status: Full Family Communication: Wife at bedside Disposition Plan: Home after UTI symptoms improve Consults called: None Admission status: Place in 58   Patrick Everett, [facility]  How to contact the [facility] Attending or Consulting provider [place] or covering provider during after hours [place], for this patient?  Check the care team in [facility] and look for a) attending/consulting [facility] provider listed and b) the [facility] team listed Log into [email or web address]  Amion Physician Scheduling and messaging for groups and whole hospitals  On call and physician scheduling software for group practices, residents, hospitalists and other medical providers for call, clinic, rotation and shift schedules. OnCall Enterprise is a hospital-wide system for scheduling doctors and paging doctors on call. EasyPlot is for scientific  plotting and data analysis.  [email or web address]  and use [facility]'s universal password to access. If you do not have the password, please contact the hospital operator.  Locate the [facility] provider you are looking for under [facility] and page to a number that you can be directly reached. If you still have difficulty reaching the provider, please page the [facility] (Director on Call) for the Hospitalists listed on amion for assistance.  09/14/2020, 2:35 AM

## 2020-09-14 NOTE — Progress Notes (Signed)
Pharmacy Antibiotic Note  Patrick Everett is a 65 y.o. male admitted on 09/13/2020 with UTI, foley removed earlier today and pt developed fever, pelvic pain and unable to urinate.  Pharmacy has been consulted to dose cefepime.  Plan: Cefepime 2gm IV q12h Follow renal function ,cultures and clinical course  Height: 5\' 9"  (175.3 cm) Weight: 81.2 kg (179 lb) IBW/kg (Calculated) : 70.7  Temp (24hrs), Avg:102.3 F (39.1 C), Min:101.4 F (38.6 C), Max:103.1 F (39.5 C)  Recent Labs  Lab 09/13/20 2343  WBC 13.6*  CREATININE 1.38*  LATICACIDVEN 1.8    Estimated Creatinine Clearance: 53.4 mL/min (A) (by C-G formula based on SCr of 1.38 mg/dL (H)).    No Known Allergies   Thank you for allowing pharmacy to be a part of this patient's care.  Dolly Rias RPh 09/14/2020, 2:53 AM

## 2020-09-15 DIAGNOSIS — N1831 Chronic kidney disease, stage 3a: Secondary | ICD-10-CM

## 2020-09-15 DIAGNOSIS — E876 Hypokalemia: Secondary | ICD-10-CM | POA: Diagnosis not present

## 2020-09-15 DIAGNOSIS — D72825 Bandemia: Secondary | ICD-10-CM

## 2020-09-15 DIAGNOSIS — A4150 Gram-negative sepsis, unspecified: Secondary | ICD-10-CM | POA: Diagnosis not present

## 2020-09-15 DIAGNOSIS — C61 Malignant neoplasm of prostate: Secondary | ICD-10-CM | POA: Diagnosis not present

## 2020-09-15 DIAGNOSIS — T83511A Infection and inflammatory reaction due to indwelling urethral catheter, initial encounter: Secondary | ICD-10-CM | POA: Diagnosis not present

## 2020-09-15 LAB — CBC WITH DIFFERENTIAL/PLATELET
Abs Immature Granulocytes: 0.26 10*3/uL — ABNORMAL HIGH (ref 0.00–0.07)
Basophils Absolute: 0 10*3/uL (ref 0.0–0.1)
Basophils Relative: 0 %
Eosinophils Absolute: 0.2 10*3/uL (ref 0.0–0.5)
Eosinophils Relative: 1 %
HCT: 32.9 % — ABNORMAL LOW (ref 39.0–52.0)
Hemoglobin: 10.2 g/dL — ABNORMAL LOW (ref 13.0–17.0)
Immature Granulocytes: 1 %
Lymphocytes Relative: 8 %
Lymphs Abs: 1.8 10*3/uL (ref 0.7–4.0)
MCH: 22.3 pg — ABNORMAL LOW (ref 26.0–34.0)
MCHC: 31 g/dL (ref 30.0–36.0)
MCV: 71.8 fL — ABNORMAL LOW (ref 80.0–100.0)
Monocytes Absolute: 0.9 10*3/uL (ref 0.1–1.0)
Monocytes Relative: 4 %
Neutro Abs: 20.6 10*3/uL — ABNORMAL HIGH (ref 1.7–7.7)
Neutrophils Relative %: 86 %
Platelets: 261 10*3/uL (ref 150–400)
RBC: 4.58 MIL/uL (ref 4.22–5.81)
RDW: 17 % — ABNORMAL HIGH (ref 11.5–15.5)
WBC: 23.9 10*3/uL — ABNORMAL HIGH (ref 4.0–10.5)
nRBC: 0 % (ref 0.0–0.2)

## 2020-09-15 LAB — RENAL FUNCTION PANEL
Albumin: 2.7 g/dL — ABNORMAL LOW (ref 3.5–5.0)
Anion gap: 6 (ref 5–15)
BUN: 12 mg/dL (ref 8–23)
CO2: 23 mmol/L (ref 22–32)
Calcium: 8.3 mg/dL — ABNORMAL LOW (ref 8.9–10.3)
Chloride: 106 mmol/L (ref 98–111)
Creatinine, Ser: 1.3 mg/dL — ABNORMAL HIGH (ref 0.61–1.24)
GFR, Estimated: >60 mL/min (ref >60)
Glucose, Bld: 105 mg/dL — ABNORMAL HIGH (ref 70–99)
Phosphorus: 2.2 mg/dL — ABNORMAL LOW (ref 2.5–4.6)
Potassium: 3.3 mmol/L — ABNORMAL LOW (ref 3.5–5.1)
Sodium: 135 mmol/L (ref 135–145)

## 2020-09-15 LAB — MAGNESIUM: Magnesium: 1.6 mg/dL — ABNORMAL LOW (ref 1.7–2.4)

## 2020-09-15 MED ORDER — MAGNESIUM SULFATE 2 GM/50ML IV SOLN
2.0000 g | Freq: Once | INTRAVENOUS | Status: AC
  Administered 2020-09-15: 2 g via INTRAVENOUS
  Filled 2020-09-15: qty 50

## 2020-09-15 MED ORDER — POTASSIUM CHLORIDE CRYS ER 20 MEQ PO TBCR
40.0000 meq | EXTENDED_RELEASE_TABLET | ORAL | Status: AC
  Administered 2020-09-15 (×2): 40 meq via ORAL
  Filled 2020-09-15 (×2): qty 2

## 2020-09-15 NOTE — Progress Notes (Signed)
PROGRESS NOTE  Patrick Everett JXB:[id number] DOB: 20-Jan-1956   PCP: Kristie Cowman, MD  Patient is from: Home  DOA: 09/13/2020 LOS: 1  Chief complaints:  Chief Complaint  Patient presents with   Urinary Retention     Brief Narrative / Interim history: 65 year old M with PMH of HTN, prostate cancer, recent hospitalization from 5/29-6/1 due to AKI in the setting of urinary retention returning with fever and inability to void after Foley was removed at urology office, and admitted for sepsis due to urinary tract infection.  Indwelling Foley placed.  Started on IV cefepime.  Urine culture with Pseudomonas aeruginosa.  Sensitivity pending.  Subjective: Seen and examined earlier this morning.  No major events overnight of this morning.  He is asking when he could go home.  He reports suprapubic tenderness but denies pain, shortness of breath or back pain.   Objective: Vitals:   09/15/20 0013 09/15/20 0416 09/15/20 0633 09/15/20 1352  BP: 124/74 129/87 132/87 134/81  Pulse: 83 89 92 92  Resp: 18 19 20 20   Temp: 99.6 F (37.6 C) (!) 100.7 F (38.2 C) 98.6 F (37 C) (!) 100.7 F (38.2 C)  TempSrc: Oral Oral Oral Oral  SpO2: 95% 98% 96% 100%  Weight:      Height:        Intake/Output Summary (Last 24 hours) at 09/15/2020 1452 Last data filed at 09/15/2020 1200 Gross per 24 hour  Intake 2876.99 ml  Output 2475 ml  Net 401.99 ml   Filed Weights   09/13/20 2312  Weight: 81.2 kg    Examination:  GENERAL: No apparent distress.  Nontoxic. HEENT: MMM.  Vision and hearing grossly intact.  NECK: Supple.  No apparent JVD.  RESP:  No IWOB.  Fair aeration bilaterally. CVS:  RRR. Heart sounds normal.  ABD/GI/GU: BS+. Abd soft, NTND.  Indwelling Foley in place.  Clear looking urine in Foley bag. MSK/EXT:  Moves extremities. No apparent deformity. No edema.  SKIN: no apparent skin lesion or wound NEURO: Awake, alert and oriented appropriately.  No apparent focal neuro deficit. PSYCH:  Calm. Normal affect.   Procedures:  None  Microbiology summarized: [id number] and influenza PCR nonreactive. Blood cultures NGTD. Urine culture with Pseudomonas aeruginosa.  Assessment & Plan: Sepsis due to catheter associated Pseudomonas aeruginosa UTI: POA. -Continue IV cefepime -Follow culture sensitivity -Continue IV fluid   Acute urinary retention in patient with history of prostate cancer: Foley discontinued at urology office on the day of admission and he could not void. -Continue indwelling Foley. -Continue Flomax -Voiding trial outpatient at urology office.   Essential hypertension: Normotensive. -Continue holding amlodipine.   CKD-3A?  Improving. Recent Labs    08/27/20 1851 08/28/20 0052 08/28/20 0312 08/29/20 0249 08/30/20 0309 09/13/20 2343 09/14/20 0455 09/15/20 0813  BUN 20 19 19 18 12 16 17 12   CREATININE 3.41* 2.93* 2.64* 1.65* 1.30* 1.38* 1.35* 1.30*  -Continue monitoring. -Continue IV fluid  Hypokalemia/hypomagnesemia: K3.3.  Mg 1.6. -Replenish and recheck.  Leukocytosis/bandemia: Likely due to #1.  Looks worse today. -Recheck in the morning    Body mass index is 26.43 kg/m.         DVT prophylaxis:  SCDs Start: 09/14/20 0219  Code Status: Full code Family Communication: Patient and/or RN. Available if any question.  Level of care: Telemetry Status is: Inpatient  Remains inpatient appropriate because:Persistent severe electrolyte disturbances, IV treatments appropriate due to intensity of illness or inability to take PO, and Inpatient level of care appropriate due  to severity of illness  Dispo: The patient is from: Home              Anticipated d/c is to: Home              Patient currently is not medically stable to d/c.   Difficult to place patient No       Consultants:  None   Sch Meds:  Scheduled Meds:  Chlorhexidine Gluconate Cloth  6 each Topical Daily   potassium chloride  40 mEq Oral Q4H   tamsulosin  0.4 mg  Oral Daily   Continuous Infusions:  ceFEPime (MAXIPIME) IV Stopped (09/15/20 0515)   lactated ringers 100 mL/hr at 09/15/20 0924   PRN Meds:.acetaminophen **OR** acetaminophen, ondansetron **OR** ondansetron (ZOFRAN) IV  Antimicrobials: Anti-infectives (From admission, onward)    Start     Dose/Rate Route Frequency Ordered Stop   09/14/20 0400  ceFEPIme (MAXIPIME) 2 g in sodium chloride 0.9 % 100 mL IVPB        2 g 200 mL/hr over 30 Minutes Intravenous Every 12 hours 09/14/20 0256     09/13/20 2345  piperacillin-tazobactam (ZOSYN) IVPB 3.375 g        3.375 g 100 mL/hr over 30 Minutes Intravenous  Once 09/13/20 2331 09/14/20 0104        I have personally reviewed the following labs and images: CBC: Recent Labs  Lab 09/13/20 2343 09/14/20 0455 09/15/20 0813  WBC 13.6* 19.3* 23.9*  NEUTROABS 12.2*  --  20.6*  HGB 11.7* 10.2* 10.2*  HCT 38.9* 33.6* 32.9*  MCV 73.3* 72.6* 71.8*  PLT 318 278 261   BMP &GFR Recent Labs  Lab 09/13/20 2343 09/14/20 0455 09/15/20 0813  NA 139 138 135  K 3.3* 2.9* 3.3*  CL 105 108 106  CO2 24 21* 23  GLUCOSE 126* 153* 105*  BUN 16 17 12   CREATININE 1.38* 1.35* 1.30*  CALCIUM 9.1 8.7* 8.3*  MG  --   --  1.6*  PHOS  --   --  2.2*   Estimated Creatinine Clearance: 56.7 mL/min (A) (by C-G formula based on SCr of 1.3 mg/dL (H)). Liver & Pancreas: Recent Labs  Lab 09/13/20 2343 09/15/20 0813  AST 17  --   ALT 14  --   ALKPHOS 65  --   BILITOT 0.7  --   PROT 7.2  --   ALBUMIN 3.8 2.7*   No results for input(s): LIPASE, AMYLASE in the last 168 hours. No results for input(s): AMMONIA in the last 168 hours. Diabetic: No results for input(s): HGBA1C in the last 72 hours. No results for input(s): GLUCAP in the last 168 hours. Cardiac Enzymes: No results for input(s): CKTOTAL, CKMB, CKMBINDEX, TROPONINI in the last 168 hours. No results for input(s): PROBNP in the last 8760 hours. Coagulation Profile: Recent Labs  Lab  09/13/20 2343  INR 1.1   Thyroid Function Tests: No results for input(s): TSH, T4TOTAL, FREET4, T3FREE, THYROIDAB in the last 72 hours. Lipid Profile: No results for input(s): CHOL, HDL, LDLCALC, TRIG, CHOLHDL, LDLDIRECT in the last 72 hours. Anemia Panel: No results for input(s): VITAMINB12, FOLATE, FERRITIN, TIBC, IRON, RETICCTPCT in the last 72 hours. Urine analysis:    Component Value Date/Time   COLORURINE RED (A) 09/13/2020 2343   APPEARANCEUR CLOUDY (A) 09/13/2020 2343   LABSPEC 1.013 09/13/2020 2343   PHURINE  09/13/2020 2343    TEST NOT REPORTED DUE TO COLOR INTERFERENCE OF URINE PIGMENT   GLUCOSEU (A) 09/13/2020  2343    TEST NOT REPORTED DUE TO COLOR INTERFERENCE OF URINE PIGMENT   HGBUR (A) 09/13/2020 2343    TEST NOT REPORTED DUE TO COLOR INTERFERENCE OF URINE PIGMENT   BILIRUBINUR (A) 09/13/2020 2343    TEST NOT REPORTED DUE TO COLOR INTERFERENCE OF URINE PIGMENT   KETONESUR (A) 09/13/2020 2343    TEST NOT REPORTED DUE TO COLOR INTERFERENCE OF URINE PIGMENT   PROTEINUR (A) 09/13/2020 2343    TEST NOT REPORTED DUE TO COLOR INTERFERENCE OF URINE PIGMENT   NITRITE (A) 09/13/2020 2343    TEST NOT REPORTED DUE TO COLOR INTERFERENCE OF URINE PIGMENT   LEUKOCYTESUR (A) 09/13/2020 2343    TEST NOT REPORTED DUE TO COLOR INTERFERENCE OF URINE PIGMENT   Sepsis Labs: Invalid input(s): PROCALCITONIN, [place]  Microbiology: Recent Results (from the past 240 hour(s))  Urine culture     Status: Abnormal (Preliminary result)   Collection Time: 09/13/20 11:43 PM   Specimen: In/Out Cath Urine  Result Value Ref Range Status   Specimen Description   Final    IN/OUT CATH URINE Performed at [facility], [place] [address] [place], [place] [address]    Special Requests   Final    NONE Performed at [facility], [place] [address] [place], [place] [address]    Culture (A)  Final    >=100,000 COLONIES/mL PSEUDOMONAS  AERUGINOSA SUSCEPTIBILITIES TO FOLLOW CULTURE REINCUBATED FOR BETTER GROWTH Performed at [facility], [place] [address] [place], [place] [address]    Report Status PENDING  Incomplete  Blood Culture (routine x 2)     Status: None (Preliminary result)   Collection Time: 09/13/20 11:43 PM   Specimen: BLOOD  Result Value Ref Range Status   Specimen Description   Final    BLOOD BLOOD LEFT FOREARM Performed at [facility] [address], [place], [place] [address]    Special Requests   Final    BOTTLES DRAWN AEROBIC AND ANAEROBIC Blood Culture adequate volume Performed at [facility] [address], [place], [place] [address]    Culture   Final    NO GROWTH 1 DAY Performed at [facility], [place] [address], [place], [place] [address]    Report Status PENDING  Incomplete  Blood Culture (routine x 2)     Status: None (Preliminary result)   Collection Time: 09/13/20 11:43 PM   Specimen: BLOOD  Result Value Ref Range Status   Specimen Description   Final    BLOOD BLOOD RIGHT FOREARM Performed at [facility] [address] [place], [place] [address]    Special Requests   Final    BOTTLES DRAWN AEROBIC AND ANAEROBIC Blood Culture results may not be optimal due to an excessive volume of blood received in culture bottles Performed at [facility] [address] [place], [place] [address]    Culture   Final    NO GROWTH 1 DAY Performed at [facility], [place] [address], [place],  [address]    Report Status PENDING  Incomplete  Resp Panel by RT-PCR (Flu A&B, Covid) Nasopharyngeal Swab     Status: None   Collection Time: 09/13/20 11:43 PM   Specimen: Nasopharyngeal Swab; Nasopharyngeal(NP) swabs in vial transport medium  Result Value Ref Range Status   SARS Coronavirus 2 by RT PCR NEGATIVE NEGATIVE Final    Comment: (NOTE) SARS-CoV-2 target nucleic acids are NOT DETECTED.  The  SARS-CoV-2 RNA is generally detectable in upper respiratory specimens during the acute phase of infection. The lowest concentration of SARS-CoV-2 viral copies this assay can detect is 138 copies/mL. A negative result does not preclude SARS-Cov-2 infection and should not be used as the sole basis for treatment or other patient management decisions. A negative result may occur with  improper specimen collection/handling, submission of specimen other than nasopharyngeal swab, presence of viral mutation(s) within the areas targeted by this assay, and inadequate number of viral copies(<138 copies/mL). A negative result must be combined with clinical observations, patient history, and epidemiological information. The expected result is Negative.  Fact Sheet for Patients:  EntrepreneurPulse.com.au  Fact Sheet for Healthcare Providers:  IncredibleEmployment.be  This test is no t yet approved or cleared by the [place] FDA and  has been authorized for detection and/or diagnosis of SARS-CoV-2 by FDA under an Emergency Use Authorization (EUA). This EUA will remain  in effect (meaning this test can be used) for the duration of the COVID-19 declaration under Section 564(b)(1) of the Act, 21 U.S.C.section 360bbb-3(b)(1), unless the authorization is terminated  or revoked sooner.       Influenza A by PCR NEGATIVE NEGATIVE Final   Influenza B by PCR NEGATIVE NEGATIVE Final    Comment: (NOTE) The Xpert Xpress SARS-CoV-2/FLU/RSV plus assay is intended as an aid in the diagnosis of influenza from Nasopharyngeal swab specimens and should not be used as a sole basis for treatment. Nasal washings and aspirates are unacceptable for Xpert Xpress SARS-CoV-2/FLU/RSV testing.  Fact Sheet for Patients: EntrepreneurPulse.com.au  Fact Sheet for Healthcare Providers: IncredibleEmployment.be  This test is not yet approved or  cleared by the [place] FDA and has been authorized for detection and/or diagnosis of SARS-CoV-2 by FDA under an Emergency Use Authorization (EUA). This EUA will remain in effect (meaning this test can be used) for the duration of the COVID-19 declaration under Section 564(b)(1) of the Act, 21 U.S.C. section 360bbb-3(b)(1), unless the authorization is terminated or revoked.  Performed at [facility], [place] [address] [place], [place] [address]     Radiology Studies: No results found.    Malavika Lira T. [place]  If 7PM-7AM, please contact night-coverage [email or web address] 09/15/2020, 2:52 PM

## 2020-09-16 DIAGNOSIS — N390 Urinary tract infection, site not specified: Secondary | ICD-10-CM | POA: Diagnosis not present

## 2020-09-16 DIAGNOSIS — R338 Other retention of urine: Secondary | ICD-10-CM | POA: Diagnosis not present

## 2020-09-16 DIAGNOSIS — A4152 Sepsis due to Pseudomonas: Secondary | ICD-10-CM | POA: Diagnosis not present

## 2020-09-16 DIAGNOSIS — T83511A Infection and inflammatory reaction due to indwelling urethral catheter, initial encounter: Secondary | ICD-10-CM | POA: Diagnosis not present

## 2020-09-16 LAB — RENAL FUNCTION PANEL
Albumin: 2.9 g/dL — ABNORMAL LOW (ref 3.5–5.0)
Anion gap: 6 (ref 5–15)
BUN: 8 mg/dL (ref 8–23)
CO2: 24 mmol/L (ref 22–32)
Calcium: 8.5 mg/dL — ABNORMAL LOW (ref 8.9–10.3)
Chloride: 106 mmol/L (ref 98–111)
Creatinine, Ser: 1.09 mg/dL (ref 0.61–1.24)
GFR, Estimated: >60 mL/min (ref >60)
Glucose, Bld: 105 mg/dL — ABNORMAL HIGH (ref 70–99)
Phosphorus: 2.5 mg/dL (ref 2.5–4.6)
Potassium: 3.9 mmol/L (ref 3.5–5.1)
Sodium: 136 mmol/L (ref 135–145)

## 2020-09-16 LAB — CBC WITH DIFFERENTIAL/PLATELET
Abs Immature Granulocytes: 0.17 10*3/uL — ABNORMAL HIGH (ref 0.00–0.07)
Basophils Absolute: 0 10*3/uL (ref 0.0–0.1)
Basophils Relative: 0 %
Eosinophils Absolute: 0.5 10*3/uL (ref 0.0–0.5)
Eosinophils Relative: 3 %
HCT: 33.1 % — ABNORMAL LOW (ref 39.0–52.0)
Hemoglobin: 10.1 g/dL — ABNORMAL LOW (ref 13.0–17.0)
Immature Granulocytes: 1 %
Lymphocytes Relative: 8 %
Lymphs Abs: 1.5 10*3/uL (ref 0.7–4.0)
MCH: 21.7 pg — ABNORMAL LOW (ref 26.0–34.0)
MCHC: 30.5 g/dL (ref 30.0–36.0)
MCV: 71 fL — ABNORMAL LOW (ref 80.0–100.0)
Monocytes Absolute: 0.9 10*3/uL (ref 0.1–1.0)
Monocytes Relative: 5 %
Neutro Abs: 15.6 10*3/uL — ABNORMAL HIGH (ref 1.7–7.7)
Neutrophils Relative %: 83 %
Platelets: 239 10*3/uL (ref 150–400)
RBC: 4.66 MIL/uL (ref 4.22–5.81)
RDW: 16.6 % — ABNORMAL HIGH (ref 11.5–15.5)
WBC: 18.7 10*3/uL — ABNORMAL HIGH (ref 4.0–10.5)
nRBC: 0 % (ref 0.0–0.2)

## 2020-09-16 LAB — CK: Total CK: 35 U/L — ABNORMAL LOW (ref 49–397)

## 2020-09-16 LAB — MAGNESIUM: Magnesium: 1.9 mg/dL (ref 1.7–2.4)

## 2020-09-16 MED ORDER — CIPROFLOXACIN HCL 500 MG PO TABS: 500.0000 mg | ORAL_TABLET | Freq: Two times a day (BID) | ORAL | 0 refills | Status: AC

## 2020-09-16 MED ORDER — SODIUM CHLORIDE 0.9 % IV SOLN
2.0000 g | Freq: Three times a day (TID) | INTRAVENOUS | Status: DC
  Administered 2020-09-16: 2 g via INTRAVENOUS
  Filled 2020-09-16 (×2): qty 2

## 2020-09-16 MED ORDER — CIPROFLOXACIN HCL 500 MG PO TABS: 500.0000 mg | ORAL_TABLET | Freq: Two times a day (BID) | ORAL | 0 refills | Status: DC

## 2020-09-16 NOTE — Progress Notes (Signed)
Physician Discharge Summary  Patrick Everett FAO:[id number] DOB: July 13, 1955 DOA: 09/13/2020  PCP: Kristie Cowman, MD  Admit date: 09/13/2020 Discharge date: 09/16/2020  Admitted From: Home Disposition: Home  Recommendations for Outpatient Follow-up:  Follow ups as below. Please obtain CBC/BMP/Mag at follow up Please follow up on the following pending results: None  Home Health: None required Equipment/Devices: None required  Discharge Condition: Stable CODE STATUS: Full code   Follow-up Information     [name], [name], MD. Schedule an appointment as soon as possible for a visit in 1 week(s).   Specialty: Urology Why: For voiding trial Contact information: [address] [place] [address] [phone]                 Hospital Course: 65 year old M with PMH of HTN, prostate cancer, recent hospitalization from 5/29-6/1 due to AKI in the setting of urinary retention returning with fever and inability to void after Foley was removed at urology office, and admitted for sepsis due to urinary tract infection.  Indwelling Foley placed.  Started on IV cefepime that he received for 3 days in-house with improvement in his symptoms.  Urine culture with Pseudomonas aeruginosa sensitive to ciprofloxacin.  Patient is started on ciprofloxacin for 6 more days.  He will follow-up with urology for voiding trial.  See individual problem list below for more on hospital course.  Discharge Diagnoses:  Sepsis due to catheter associated Pseudomonas aeruginosa UTI: POA.  Leukocytosis improved. -IV cefepime 6/15-6/18.  Discharged on Cipro for 6 more days   Acute urinary retention in patient with history of prostate cancer: Foley discontinued at urology office on the day of admission bilaterally failed voiding trial.  Foley replaced in ED -Continue indwelling Foley.  Voiding trial at urology office.     Essential hypertension: Normotensive. -Continue home medications      CKD-3A?  Likely AKI.  Renal function back to normal now Recent Labs    08/27/20 1851 08/28/20 0052 08/28/20 0312 08/29/20 0249 08/30/20 0309 09/13/20 2343 09/14/20 0455 09/15/20 0813 09/16/20 0544  BUN 20 19 19 18 12 16 17 12 8   CREATININE 3.41* 2.93* 2.64* 1.65* 1.30* 1.38* 1.35* 1.30* 1.09  -Recheck renal function at follow-up.   Hypokalemia/hypomagnesemia: Resolved.    Leukocytosis/bandemia: Likely due to #1.  Improved. -Recheck CBC at follow-up   Body mass index is 26.43 kg/m.            Discharge Exam: Vitals:   09/15/20 2216 09/16/20 0428  BP: (!) 141/88 (!) 134/92  Pulse: 85 86  Resp: 20 20  Temp: 98.5 F (36.9 C) 100 F (37.8 C)  SpO2: 100% 99%    GENERAL: No apparent distress.  Nontoxic. HEENT: MMM.  Vision and hearing grossly intact.  NECK: Supple.  No apparent JVD.  RESP:  No IWOB.  Fair aeration bilaterally. CVS:  RRR. Heart sounds normal.  ABD/GI/GU: Bowel sounds present. Soft. Non tender.  Indwelling Foley. MSK/EXT:  Moves extremities. No apparent deformity. No edema.  SKIN: no apparent skin lesion or wound NEURO: Awake, alert and oriented appropriately.  No apparent focal neuro deficit. PSYCH: Calm. Normal affect.   Discharge Instructions  Discharge Instructions     Call MD for:  extreme fatigue   Complete by: As directed    Call MD for:  persistant dizziness or light-headedness   Complete by: As directed    Call MD for:  persistant nausea and vomiting   Complete by: As directed    Call  MD for:  severe uncontrolled pain   Complete by: As directed    Call MD for:  temperature >100.4   Complete by: As directed    Diet - low sodium heart healthy   Complete by: As directed    Discharge instructions   Complete by: As directed    It has been a pleasure taking care of you!  You were hospitalized due to urinary tract infection and difficulty urinating.  We have replaced Foley catheter for difficulty urinating.  We have treated you  with antibiotics for infection.  Your symptoms and kidney functions improved.  We are discharging you on more antibiotics to complete treatment course for the infection.  Please follow-up with urology in 1 week for Foley removal and voiding trial.  Please review your new medication list and the directions on your medications before you take them.   Take care,   Increase activity slowly   Complete by: As directed       Allergies as of 09/16/2020   No Known Allergies      Medication List     TAKE these medications    amLODipine 10 MG tablet Commonly known as: NORVASC Take 1 tablet by mouth daily.   ciprofloxacin 500 MG tablet Commonly known as: Cipro Take 1 tablet (500 mg total) by mouth 2 (two) times daily for 6 days.   tamsulosin 0.4 MG Caps capsule Commonly known as: FLOMAX Take 0.4 mg by mouth daily.   Vitamin D (Ergocalciferol) 1.25 MG (50000 UNIT) Caps capsule Commonly known as: DRISDOL Take 50,000 Units by mouth every 7 (seven) days. Saturday        Consultations: Urology  Procedures/Studies:  CT ABDOMEN PELVIS WO CONTRAST  Result Date: 08/27/2020 CLINICAL DATA:  Diarrhea for 2 days, nausea and vomiting, emesis, pain EXAM: CT ABDOMEN AND PELVIS WITHOUT CONTRAST TECHNIQUE: Multidetector CT imaging of the abdomen and pelvis was performed following the standard protocol without IV contrast. COMPARISON:  None. FINDINGS: Lower chest: No acute pleural or parenchymal lung disease. Hepatobiliary: Unenhanced imaging of the liver demonstrates no gross abnormalities. No biliary duct dilation. The gallbladder is unremarkable. Pancreas: Unremarkable. No pancreatic ductal dilatation or surrounding inflammatory changes. Spleen: Normal in size without focal abnormality. Adrenals/Urinary Tract: No evidence of urinary tract calculi. There is mild distension of the bilateral renal collecting systems and ureters, likely due to significant bladder distension. Bladder distension is  likely due to chronic bladder outlet obstruction given a markedly enlarged and heterogeneous prostate. Significant bilateral perirenal fat stranding also felt to be the sequela of chronic bladder outlet obstruction. The adrenals are grossly normal. Stomach/Bowel: No bowel obstruction or ileus. Normal appendix right lower quadrant. No bowel wall thickening or inflammatory change. Vascular/Lymphatic: No significant vascular findings on this unenhanced exam. No pathologic adenopathy within the abdomen or pelvis. Reproductive: The prostate is heterogeneous and enlarged, measuring approximately 6.3 x 7.4 x 6.3 cm. Other: There is trace free fluid within the pelvis. No free intraperitoneal gas. No abdominal wall hernia. Musculoskeletal: No acute or destructive bony lesions. Reconstructed images demonstrate no additional findings. IMPRESSION: 1. Markedly enlarged prostate, with evidence of chronic bladder outlet obstruction. 2. Trace pelvic free fluid, nonspecific. 3. No evidence of bowel obstruction or ileus. No CT findings to explain the patient's diarrhea and nausea/vomiting. Electronically Signed   By: Randa Ngo M.D.   On: 08/27/2020 20:30   US RENAL  Result Date: 08/28/2020 CLINICAL DATA:  Acute kidney injury EXAM: RENAL / URINARY TRACT ULTRASOUND COMPLETE COMPARISON:  08/27/2020 CT abdomen pelvis FINDINGS: Right Kidney: Renal measurements: 12.1 x 5.5 x 5.9 cm = volume: 204.4 mL. Echogenicity within normal limits. No mass or hydronephrosis visualized. Left Kidney: Renal measurements: 11.3 x 5.3 x 5.4 cm = volume: 170.4 mL. Echogenicity within normal limits. No mass or hydronephrosis visualized. Bladder: Appears normal for degree of bladder distention. Small amount of debris in the bladder. Foley catheter in the bladder. Other: None. IMPRESSION: 1. Normal renal ultrasound. Electronically Signed   By: Kathreen Devoid   On: 08/28/2020 15:24   DG Chest Port 1 View  Result Date: 09/13/2020 CLINICAL DATA:   Possible sepsis EXAM: PORTABLE CHEST 1 VIEW COMPARISON:  None. FINDINGS: Right lung is grossly clear. Streaky and hazy atelectasis left base. No consolidation or effusion. Normal cardiomediastinal silhouette. No pneumothorax IMPRESSION: Streaky and hazy atelectasis at the left base Electronically Signed   By: Donavan Foil M.D.   On: 09/13/2020 23:31       The results of significant diagnostics from this hospitalization (including imaging, microbiology, ancillary and laboratory) are listed below for reference.     Microbiology: Recent Results (from the past 240 hour(s))  Urine culture     Status: Abnormal (Preliminary result)   Collection Time: 09/13/20 11:43 PM   Specimen: In/Out Cath Urine  Result Value Ref Range Status   Specimen Description   Final    IN/OUT CATH URINE Performed at [facility], [place] [address], [place], [place] [address]    Special Requests   Final    NONE Performed at [facility], [place] [address] [place], [place] [address]    Culture (A)  Final    70,000 COLONIES/mL PSEUDOMONAS AERUGINOSA 30,000 COLONIES/mL KLEBSIELLA PNEUMONIAE SUSCEPTIBILITIES TO FOLLOW Performed at [facility], [place] [address] [place], [place] [address]    Report Status PENDING  Incomplete   Organism ID, Bacteria PSEUDOMONAS AERUGINOSA (A)  Final      Susceptibility   Pseudomonas aeruginosa - MIC*    CEFTAZIDIME 4 SENSITIVE Sensitive     CIPROFLOXACIN <=0.25 SENSITIVE Sensitive     GENTAMICIN 2 SENSITIVE Sensitive     IMIPENEM 1 SENSITIVE Sensitive     PIP/TAZO 8 SENSITIVE Sensitive     CEFEPIME 2 SENSITIVE Sensitive     * 70,000 COLONIES/mL PSEUDOMONAS AERUGINOSA  Blood Culture (routine x 2)     Status: None (Preliminary result)   Collection Time: 09/13/20 11:43 PM   Specimen: BLOOD  Result Value Ref Range Status   Specimen Description   Final    BLOOD BLOOD LEFT FOREARM Performed at [facility] [address], [place], [place] [address]    Special Requests   Final    BOTTLES DRAWN AEROBIC AND ANAEROBIC Blood Culture adequate volume Performed at [facility] [address], [place], [place] [address]    Culture   Final    NO GROWTH 1 DAY Performed at [facility], [place] [address] [place], [place] [address]    Report Status PENDING  Incomplete  Blood Culture (routine x 2)     Status: None (Preliminary result)   Collection Time: 09/13/20 11:43 PM   Specimen: BLOOD  Result Value Ref Range Status   Specimen Description   Final    BLOOD BLOOD RIGHT FOREARM Performed at [facility] [address] [place], [place] [address]    Special Requests   Final    BOTTLES DRAWN AEROBIC AND ANAEROBIC Blood  Culture results may not be optimal due to an excessive volume of blood received in culture bottles Performed at [facility] [address], [place], [place] [address]    Culture   Final    NO GROWTH 1 DAY Performed at [facility], [place] [address], [place], [place] [address]    Report Status PENDING  Incomplete  Resp Panel by RT-PCR (Flu A&B, Covid) Nasopharyngeal Swab     Status: None   Collection Time: 09/13/20 11:43 PM   Specimen: Nasopharyngeal Swab; Nasopharyngeal(NP) swabs in vial transport medium  Result Value Ref Range Status   SARS Coronavirus 2 by RT PCR NEGATIVE NEGATIVE Final    Comment: (NOTE) SARS-CoV-2 target nucleic acids are NOT DETECTED.  The SARS-CoV-2 RNA is generally detectable in upper respiratory specimens during the acute phase of infection. The lowest concentration of SARS-CoV-2 viral copies this assay can detect is 138 copies/mL. A negative result does not preclude SARS-Cov-2 infection and should not be used as the sole basis for treatment or other patient management decisions. A negative result may occur with  improper specimen collection/handling, submission of specimen other than nasopharyngeal  swab, presence of viral mutation(s) within the areas targeted by this assay, and inadequate number of viral copies(<138 copies/mL). A negative result must be combined with clinical observations, patient history, and epidemiological information. The expected result is Negative.  Fact Sheet for Patients:  EntrepreneurPulse.com.au  Fact Sheet for Healthcare Providers:  IncredibleEmployment.be  This test is no t yet approved or cleared by the [place] FDA and  has been authorized for detection and/or diagnosis of SARS-CoV-2 by FDA under an Emergency Use Authorization (EUA). This EUA will remain  in effect (meaning this test can be used) for the duration of the COVID-19 declaration under Section 564(b)(1) of the Act, 21 U.S.C.section 360bbb-3(b)(1), unless the authorization is terminated  or revoked sooner.       Influenza A by PCR NEGATIVE NEGATIVE Final   Influenza B by PCR NEGATIVE NEGATIVE Final    Comment: (NOTE) The Xpert Xpress SARS-CoV-2/FLU/RSV plus assay is intended as an aid in the diagnosis of influenza from Nasopharyngeal swab specimens and should not be used as a sole basis for treatment. Nasal washings and aspirates are unacceptable for Xpert Xpress SARS-CoV-2/FLU/RSV testing.  Fact Sheet for Patients: EntrepreneurPulse.com.au  Fact Sheet for Healthcare Providers: IncredibleEmployment.be  This test is not yet approved or cleared by the [place] FDA and has been authorized for detection and/or diagnosis of SARS-CoV-2 by FDA under an Emergency Use Authorization (EUA). This EUA will remain in effect (meaning this test can be used) for the duration of the COVID-19 declaration under Section 564(b)(1) of the Act, 21 U.S.C. section 360bbb-3(b)(1), unless the authorization is terminated or revoked.  Performed at [facility], [place] [address], [place],  [address]       Labs:  CBC: Recent Labs  Lab 09/13/20 2343 09/14/20 0455 09/15/20 0813 09/16/20 0544  WBC 13.6* 19.3* 23.9* 18.7*  NEUTROABS 12.2*  --  20.6* 15.6*  HGB 11.7* 10.2* 10.2* 10.1*  HCT 38.9* 33.6* 32.9* 33.1*  MCV 73.3* 72.6* 71.8* 71.0*  PLT 318 278 261 239   BMP &GFR Recent Labs  Lab 09/13/20 2343 09/14/20 0455 09/15/20 0813 09/16/20 0544  NA 139 138 135 136  K 3.3* 2.9* 3.3* 3.9  CL 105 108 106 106  CO2 24 21* 23 24  GLUCOSE 126* 153* 105* 105*  BUN 16 17 12  8  CREATININE 1.38* 1.35* 1.30* 1.09  CALCIUM 9.1 8.7* 8.3* 8.5*  MG  --   --  1.6* 1.9  PHOS  --   --  2.2* 2.5   Estimated Creatinine Clearance: 67.6 mL/min (by C-G formula based on SCr of 1.09 mg/dL). Liver & Pancreas: Recent Labs  Lab 09/13/20 2343 09/15/20 0813 09/16/20 0544  AST 17  --   --   ALT 14  --   --   ALKPHOS 65  --   --   BILITOT 0.7  --   --   PROT 7.2  --   --   ALBUMIN 3.8 2.7* 2.9*   No results for input(s): LIPASE, AMYLASE in the last 168 hours. No results for input(s): AMMONIA in the last 168 hours. Diabetic: No results for input(s): HGBA1C in the last 72 hours. No results for input(s): GLUCAP in the last 168 hours. Cardiac Enzymes: Recent Labs  Lab 09/16/20 0544  CKTOTAL 35*   No results for input(s): PROBNP in the last 8760 hours. Coagulation Profile: Recent Labs  Lab 09/13/20 2343  INR 1.1   Thyroid Function Tests: No results for input(s): TSH, T4TOTAL, FREET4, T3FREE, THYROIDAB in the last 72 hours. Lipid Profile: No results for input(s): CHOL, HDL, LDLCALC, TRIG, CHOLHDL, LDLDIRECT in the last 72 hours. Anemia Panel: No results for input(s): VITAMINB12, FOLATE, FERRITIN, TIBC, IRON, RETICCTPCT in the last 72 hours. Urine analysis:    Component Value Date/Time   COLORURINE RED (A) 09/13/2020 2343   APPEARANCEUR CLOUDY (A) 09/13/2020 2343   LABSPEC 1.013 09/13/2020 2343   PHURINE  09/13/2020 2343    TEST NOT REPORTED DUE TO COLOR INTERFERENCE OF URINE  PIGMENT   GLUCOSEU (A) 09/13/2020 2343    TEST NOT REPORTED DUE TO COLOR INTERFERENCE OF URINE PIGMENT   HGBUR (A) 09/13/2020 2343    TEST NOT REPORTED DUE TO COLOR INTERFERENCE OF URINE PIGMENT   BILIRUBINUR (A) 09/13/2020 2343    TEST NOT REPORTED DUE TO COLOR INTERFERENCE OF URINE PIGMENT   KETONESUR (A) 09/13/2020 2343    TEST NOT REPORTED DUE TO COLOR INTERFERENCE OF URINE PIGMENT   PROTEINUR (A) 09/13/2020 2343    TEST NOT REPORTED DUE TO COLOR INTERFERENCE OF URINE PIGMENT   NITRITE (A) 09/13/2020 2343    TEST NOT REPORTED DUE TO COLOR INTERFERENCE OF URINE PIGMENT   LEUKOCYTESUR (A) 09/13/2020 2343    TEST NOT REPORTED DUE TO COLOR INTERFERENCE OF URINE PIGMENT   Sepsis Labs: Invalid input(s): PROCALCITONIN, LACTICIDVEN   Time coordinating discharge: 35 minutes  SIGNED:  Mercy Riding, MD  [facility] 09/16/2020, 4:16 PM  If 7PM-7AM, please contact night-coverage [email or web address]

## 2020-09-16 NOTE — Progress Notes (Signed)
Pharmacy Antibiotic Note  Patrick Everett is a 65 y.o. male admitted on 09/13/2020 with UTI, foley removed earlier today and pt developed fever, pelvic pain and unable to urinate.  Pharmacy has been consulted to dose cefepime.  Day 3 Cefepime for pseudomonas UTI awaiting sensitivities WBC 18.7 down Tmax 100.7 yesterday, afebrile so far today SCr improved  Plan: Adjust cefepime to 2g IV q8 for improved renal function Follow renal function ,cultures and clinical course  Height: 5\' 9"  (175.3 cm) Weight: 81.2 kg (179 lb) IBW/kg (Calculated) : 70.7  Temp (24hrs), Avg:99.6 F (37.6 C), Min:98.5 F (36.9 C), Max:100.7 F (38.2 C)  Recent Labs  Lab 09/13/20 2343 09/14/20 0455 09/15/20 0813 09/16/20 0544  WBC 13.6* 19.3* 23.9* 18.7*  CREATININE 1.38* 1.35* 1.30* 1.09  LATICACIDVEN 1.8  --   --   --      Estimated Creatinine Clearance: 67.6 mL/min (by C-G formula based on SCr of 1.09 mg/dL).    No Known Allergies   Thank you for allowing pharmacy to be a part of this patient's care.  Adrian Saran, PharmD, BCPS Secure Chat if ?s 09/16/2020 10:51 AM

## 2020-09-17 LAB — URINE CULTURE: Culture: 70000 — AB

## 2020-09-17 NOTE — Discharge Summary (Addendum)
Physician Discharge Summary  Patrick Everett VHQ:[id number] DOB: Jun 11, 1955 DOA: 09/13/2020  PCP: Kristie Cowman, MD  Admit date: 09/13/2020 Discharge date: 09/16/2020  Admitted From: Home Disposition: Home  Recommendations for Outpatient Follow-up:  Follow ups as below. Please obtain CBC/BMP/Mag at follow up Please follow up on the following pending results: None  Home Health: None required Equipment/Devices: None required  Discharge Condition: Stable CODE STATUS: Full code   Follow-up Information     Winter, [name], MD. Schedule an appointment as soon as possible for a visit in 1 week(s).   Specialty: Urology Why: For voiding trial Contact information: [address] [place] [address] [phone]                 Hospital Course: 65 year old M with PMH of HTN, prostate cancer, recent hospitalization from 5/29-6/1 due to AKI in the setting of urinary retention returning with fever and inability to void after Foley was removed at urology office, and admitted for sepsis due to urinary tract infection.  Indwelling Foley placed.  Started on IV cefepime that he received for 3 days in-house with improvement in his symptoms.  Urine culture with Pseudomonas aeruginosa sensitive to ciprofloxacin.  Patient is started on ciprofloxacin for 6 more days.  He will follow-up with urology for voiding trial.  See individual problem list below for more on hospital course.  Discharge Diagnoses:  Sepsis due to catheter associated Pseudomonas aeruginosa UTI: POA.  Leukocytosis improved. -IV cefepime 6/15-6/18.  Discharged on Cipro for 6 more days   Acute urinary retention in patient with history of prostate cancer: Foley discontinued at urology office on the day of admission bilaterally failed voiding trial.  Foley replaced in ED -Continue indwelling Foley.  Voiding trial at urology office.     Essential hypertension: Normotensive. -Continue home medications      CKD-3A?  Likely AKI.  Renal function back to normal now Recent Labs    08/27/20 1851 08/28/20 0052 08/28/20 0312 08/29/20 0249 08/30/20 0309 09/13/20 2343 09/14/20 0455 09/15/20 0813 09/16/20 0544  BUN 20 19 19 18 12 16 17 12 8   CREATININE 3.41* 2.93* 2.64* 1.65* 1.30* 1.38* 1.35* 1.30* 1.09  -Recheck renal function at follow-up.   Hypokalemia/hypomagnesemia: Resolved.    Leukocytosis/bandemia: Likely due to #1.  Improved. -Recheck CBC at follow-up   Body mass index is 26.43 kg/m.            Discharge Exam: Vitals:   09/16/20 0428 09/16/20 1430  BP: (!) 134/92 130/81  Pulse: 86 82  Resp: 20 18  Temp: 100 F (37.8 C) 99.2 F (37.3 C)  SpO2: 99% 99%    GENERAL: No apparent distress.  Nontoxic. HEENT: MMM.  Vision and hearing grossly intact.  NECK: Supple.  No apparent JVD.  RESP:  No IWOB.  Fair aeration bilaterally. CVS:  RRR. Heart sounds normal.  ABD/GI/GU: Bowel sounds present. Soft. Non tender.  Indwelling Foley. MSK/EXT:  Moves extremities. No apparent deformity. No edema.  SKIN: no apparent skin lesion or wound NEURO: Awake, alert and oriented appropriately.  No apparent focal neuro deficit. PSYCH: Calm. Normal affect.   Discharge Instructions  Discharge Instructions     Call MD for:  extreme fatigue   Complete by: As directed    Call MD for:  persistant dizziness or light-headedness   Complete by: As directed    Call MD for:  persistant nausea and vomiting   Complete by: As directed    Call MD  for:  severe uncontrolled pain   Complete by: As directed    Call MD for:  temperature >100.4   Complete by: As directed    Diet - low sodium heart healthy   Complete by: As directed    Discharge instructions   Complete by: As directed    It has been a pleasure taking care of you!  You were hospitalized due to urinary tract infection and difficulty urinating.  We have replaced Foley catheter for difficulty urinating.  We have treated you with  antibiotics for infection.  Your symptoms and kidney functions improved.  We are discharging you on more antibiotics to complete treatment course for the infection.  Please follow-up with urology in 1 week for Foley removal and voiding trial.  Please review your new medication list and the directions on your medications before you take them.   Take care,   Increase activity slowly   Complete by: As directed       Allergies as of 09/16/2020   No Known Allergies      Medication List     TAKE these medications    amLODipine 10 MG tablet Commonly known as: NORVASC Take 1 tablet by mouth daily.   ciprofloxacin 500 MG tablet Commonly known as: Cipro Take 1 tablet (500 mg total) by mouth 2 (two) times daily for 6 days.   tamsulosin 0.4 MG Caps capsule Commonly known as: FLOMAX Take 0.4 mg by mouth daily.   Vitamin D (Ergocalciferol) 1.25 MG (50000 UNIT) Caps capsule Commonly known as: DRISDOL Take 50,000 Units by mouth every 7 (seven) days. Saturday        Consultations: Urology  Procedures/Studies:  CT ABDOMEN PELVIS WO CONTRAST  Result Date: 08/27/2020 CLINICAL DATA:  Diarrhea for 2 days, nausea and vomiting, emesis, pain EXAM: CT ABDOMEN AND PELVIS WITHOUT CONTRAST TECHNIQUE: Multidetector CT imaging of the abdomen and pelvis was performed following the standard protocol without IV contrast. COMPARISON:  None. FINDINGS: Lower chest: No acute pleural or parenchymal lung disease. Hepatobiliary: Unenhanced imaging of the liver demonstrates no gross abnormalities. No biliary duct dilation. The gallbladder is unremarkable. Pancreas: Unremarkable. No pancreatic ductal dilatation or surrounding inflammatory changes. Spleen: Normal in size without focal abnormality. Adrenals/Urinary Tract: No evidence of urinary tract calculi. There is mild distension of the bilateral renal collecting systems and ureters, likely due to significant bladder distension. Bladder distension is likely  due to chronic bladder outlet obstruction given a markedly enlarged and heterogeneous prostate. Significant bilateral perirenal fat stranding also felt to be the sequela of chronic bladder outlet obstruction. The adrenals are grossly normal. Stomach/Bowel: No bowel obstruction or ileus. Normal appendix right lower quadrant. No bowel wall thickening or inflammatory change. Vascular/Lymphatic: No significant vascular findings on this unenhanced exam. No pathologic adenopathy within the abdomen or pelvis. Reproductive: The prostate is heterogeneous and enlarged, measuring approximately 6.3 x 7.4 x 6.3 cm. Other: There is trace free fluid within the pelvis. No free intraperitoneal gas. No abdominal wall hernia. Musculoskeletal: No acute or destructive bony lesions. Reconstructed images demonstrate no additional findings. IMPRESSION: 1. Markedly enlarged prostate, with evidence of chronic bladder outlet obstruction. 2. Trace pelvic free fluid, nonspecific. 3. No evidence of bowel obstruction or ileus. No CT findings to explain the patient's diarrhea and nausea/vomiting. Electronically Signed   By: Randa Ngo M.D.   On: 08/27/2020 20:30   US RENAL  Result Date: 08/28/2020 CLINICAL DATA:  Acute kidney injury EXAM: RENAL / URINARY TRACT ULTRASOUND COMPLETE COMPARISON:  08/27/2020 CT abdomen pelvis FINDINGS: Right Kidney: Renal measurements: 12.1 x 5.5 x 5.9 cm = volume: 204.4 mL. Echogenicity within normal limits. No mass or hydronephrosis visualized. Left Kidney: Renal measurements: 11.3 x 5.3 x 5.4 cm = volume: 170.4 mL. Echogenicity within normal limits. No mass or hydronephrosis visualized. Bladder: Appears normal for degree of bladder distention. Small amount of debris in the bladder. Foley catheter in the bladder. Other: None. IMPRESSION: 1. Normal renal ultrasound. Electronically Signed   By: Kathreen Devoid   On: 08/28/2020 15:24   DG Chest Port 1 View  Result Date: 09/13/2020 CLINICAL DATA:  Possible  sepsis EXAM: PORTABLE CHEST 1 VIEW COMPARISON:  None. FINDINGS: Right lung is grossly clear. Streaky and hazy atelectasis left base. No consolidation or effusion. Normal cardiomediastinal silhouette. No pneumothorax IMPRESSION: Streaky and hazy atelectasis at the left base Electronically Signed   By: Donavan Foil M.D.   On: 09/13/2020 23:31       The results of significant diagnostics from this hospitalization (including imaging, microbiology, ancillary and laboratory) are listed below for reference.     Microbiology: Recent Results (from the past 240 hour(s))  Urine culture     Status: Abnormal   Collection Time: 09/13/20 11:43 PM   Specimen: In/Out Cath Urine  Result Value Ref Range Status   Specimen Description   Final    IN/OUT CATH URINE Performed at [facility] [address], [place], [place] [address]    Special Requests   Final    NONE Performed at [facility], [place] [address] [place], [place] [address]    Culture (A)  Final    70,000 COLONIES/mL PSEUDOMONAS AERUGINOSA 30,000 COLONIES/mL KLEBSIELLA PNEUMONIAE    Report Status 09/17/2020 FINAL  Final   Organism ID, Bacteria PSEUDOMONAS AERUGINOSA (A)  Final   Organism ID, Bacteria KLEBSIELLA PNEUMONIAE (A)  Final      Susceptibility   Klebsiella pneumoniae - MIC*    AMPICILLIN >=32 RESISTANT Resistant     CEFAZOLIN <=4 SENSITIVE Sensitive     CEFEPIME <=0.12 SENSITIVE Sensitive     CEFTRIAXONE <=0.25 SENSITIVE Sensitive     CIPROFLOXACIN <=0.25 SENSITIVE Sensitive     GENTAMICIN <=1 SENSITIVE Sensitive     IMIPENEM <=0.25 SENSITIVE Sensitive     NITROFURANTOIN 64 INTERMEDIATE Intermediate     TRIMETH/SULFA <=20 SENSITIVE Sensitive     AMPICILLIN/SULBACTAM 8 SENSITIVE Sensitive     PIP/TAZO 8 SENSITIVE Sensitive     * 30,000 COLONIES/mL KLEBSIELLA PNEUMONIAE   Pseudomonas aeruginosa - MIC*    CEFTAZIDIME 4 SENSITIVE Sensitive     CIPROFLOXACIN <=0.25 SENSITIVE Sensitive      GENTAMICIN 2 SENSITIVE Sensitive     IMIPENEM 1 SENSITIVE Sensitive     PIP/TAZO 8 SENSITIVE Sensitive     CEFEPIME 2 SENSITIVE Sensitive     * 70,000 COLONIES/mL PSEUDOMONAS AERUGINOSA  Blood Culture (routine x 2)     Status: None (Preliminary result)   Collection Time: 09/13/20 11:43 PM   Specimen: BLOOD  Result Value Ref Range Status   Specimen Description   Final    BLOOD BLOOD LEFT FOREARM Performed at [facility], [place] [address] [place], [place] [address]    Special Requests   Final    BOTTLES DRAWN AEROBIC AND ANAEROBIC Blood Culture adequate volume Performed at [facility] [address] [place], [place] [address]    Culture   Final    NO GROWTH 3 DAYS Performed  at [facility], [place] [address], [place], [place] [address]    Report Status PENDING  Incomplete  Blood Culture (routine x 2)     Status: None (Preliminary result)   Collection Time: 09/13/20 11:43 PM   Specimen: BLOOD  Result Value Ref Range Status   Specimen Description   Final    BLOOD BLOOD RIGHT FOREARM Performed at [facility] [address], [place], [place] [address]    Special Requests   Final    BOTTLES DRAWN AEROBIC AND ANAEROBIC Blood Culture results may not be optimal due to an excessive volume of blood received in culture bottles Performed at [facility] [address] [place], [place] [address]    Culture   Final    NO GROWTH 3 DAYS Performed at [facility], [place] [address], [place], [place] [address]    Report Status PENDING  Incomplete  Resp Panel by RT-PCR (Flu A&B, Covid) Nasopharyngeal Swab     Status: None   Collection Time: 09/13/20 11:43 PM   Specimen: Nasopharyngeal Swab; Nasopharyngeal(NP) swabs in vial transport medium  Result Value Ref Range Status   SARS Coronavirus 2 by RT PCR NEGATIVE NEGATIVE Final    Comment: (NOTE) SARS-CoV-2 target nucleic acids are NOT DETECTED.  The  SARS-CoV-2 RNA is generally detectable in upper respiratory specimens during the acute phase of infection. The lowest concentration of SARS-CoV-2 viral copies this assay can detect is 138 copies/mL. A negative result does not preclude SARS-Cov-2 infection and should not be used as the sole basis for treatment or other patient management decisions. A negative result may occur with  improper specimen collection/handling, submission of specimen other than nasopharyngeal swab, presence of viral mutation(s) within the areas targeted by this assay, and inadequate number of viral copies(<138 copies/mL). A negative result must be combined with clinical observations, patient history, and epidemiological information. The expected result is Negative.  Fact Sheet for Patients:  EntrepreneurPulse.com.au  Fact Sheet for Healthcare Providers:  IncredibleEmployment.be  This test is no t yet approved or cleared by the [place] FDA and  has been authorized for detection and/or diagnosis of SARS-CoV-2 by FDA under an Emergency Use Authorization (EUA). This EUA will remain  in effect (meaning this test can be used) for the duration of the COVID-19 declaration under Section 564(b)(1) of the Act, 21 U.S.C.section 360bbb-3(b)(1), unless the authorization is terminated  or revoked sooner.       Influenza A by PCR NEGATIVE NEGATIVE Final   Influenza B by PCR NEGATIVE NEGATIVE Final    Comment: (NOTE) The Xpert Xpress SARS-CoV-2/FLU/RSV plus assay is intended as an aid in the diagnosis of influenza from Nasopharyngeal swab specimens and should not be used as a sole basis for treatment. Nasal washings and aspirates are unacceptable for Xpert Xpress SARS-CoV-2/FLU/RSV testing.  Fact Sheet for Patients: EntrepreneurPulse.com.au  Fact Sheet for Healthcare Providers: IncredibleEmployment.be  This test is not yet approved or  cleared by the [place] FDA and has been authorized for detection and/or diagnosis of SARS-CoV-2 by FDA under an Emergency Use Authorization (EUA). This EUA will remain in effect (meaning this test can be used) for the duration of the COVID-19 declaration under Section 564(b)(1) of the Act, 21 U.S.C. section 360bbb-3(b)(1), unless the authorization is terminated or revoked.  Performed at [facility], [place] [address] [place],  [address]      Labs:  CBC: Recent Labs  Lab 09/13/20 2343 09/14/20  7209 09/15/20 0813 09/16/20 0544  WBC 13.6* 19.3* 23.9* 18.7*  NEUTROABS 12.2*  --  20.6* 15.6*  HGB 11.7* 10.2* 10.2* 10.1*  HCT 38.9* 33.6* 32.9* 33.1*  MCV 73.3* 72.6* 71.8* 71.0*  PLT 318 278 261 239   BMP &GFR Recent Labs  Lab 09/13/20 2343 09/14/20 0455 09/15/20 0813 09/16/20 0544  NA 139 138 135 136  K 3.3* 2.9* 3.3* 3.9  CL 105 108 106 106  CO2 24 21* 23 24  GLUCOSE 126* 153* 105* 105*  BUN 16 17 12 8   CREATININE 1.38* 1.35* 1.30* 1.09  CALCIUM 9.1 8.7* 8.3* 8.5*  MG  --   --  1.6* 1.9  PHOS  --   --  2.2* 2.5   Estimated Creatinine Clearance: 67.6 mL/min (by C-G formula based on SCr of 1.09 mg/dL). Liver & Pancreas: Recent Labs  Lab 09/13/20 2343 09/15/20 0813 09/16/20 0544  AST 17  --   --   ALT 14  --   --   ALKPHOS 65  --   --   BILITOT 0.7  --   --   PROT 7.2  --   --   ALBUMIN 3.8 2.7* 2.9*   No results for input(s): LIPASE, AMYLASE in the last 168 hours. No results for input(s): AMMONIA in the last 168 hours. Diabetic: No results for input(s): HGBA1C in the last 72 hours. No results for input(s): GLUCAP in the last 168 hours. Cardiac Enzymes: Recent Labs  Lab 09/16/20 0544  CKTOTAL 35*   No results for input(s): PROBNP in the last 8760 hours. Coagulation Profile: Recent Labs  Lab 09/13/20 2343  INR 1.1   Thyroid Function Tests: No results for input(s): TSH, T4TOTAL, FREET4, T3FREE, THYROIDAB in the last  72 hours. Lipid Profile: No results for input(s): CHOL, HDL, LDLCALC, TRIG, CHOLHDL, LDLDIRECT in the last 72 hours. Anemia Panel: No results for input(s): VITAMINB12, FOLATE, FERRITIN, TIBC, IRON, RETICCTPCT in the last 72 hours. Urine analysis:    Component Value Date/Time   COLORURINE RED (A) 09/13/2020 2343   APPEARANCEUR CLOUDY (A) 09/13/2020 2343   LABSPEC 1.013 09/13/2020 2343   PHURINE  09/13/2020 2343    TEST NOT REPORTED DUE TO COLOR INTERFERENCE OF URINE PIGMENT   GLUCOSEU (A) 09/13/2020 2343    TEST NOT REPORTED DUE TO COLOR INTERFERENCE OF URINE PIGMENT   HGBUR (A) 09/13/2020 2343    TEST NOT REPORTED DUE TO COLOR INTERFERENCE OF URINE PIGMENT   BILIRUBINUR (A) 09/13/2020 2343    TEST NOT REPORTED DUE TO COLOR INTERFERENCE OF URINE PIGMENT   KETONESUR (A) 09/13/2020 2343    TEST NOT REPORTED DUE TO COLOR INTERFERENCE OF URINE PIGMENT   PROTEINUR (A) 09/13/2020 2343    TEST NOT REPORTED DUE TO COLOR INTERFERENCE OF URINE PIGMENT   NITRITE (A) 09/13/2020 2343    TEST NOT REPORTED DUE TO COLOR INTERFERENCE OF URINE PIGMENT   LEUKOCYTESUR (A) 09/13/2020 2343    TEST NOT REPORTED DUE TO COLOR INTERFERENCE OF URINE PIGMENT   Sepsis Labs: Invalid input(s): PROCALCITONIN, LACTICIDVEN   Time coordinating discharge: 35 minutes  SIGNED:  Mercy Riding, MD  [facility] 09/17/2020, 4:50 PM  If 7PM-7AM, please contact night-coverage [email or web address]

## 2020-09-19 LAB — CULTURE, BLOOD (ROUTINE X 2)
Culture: NO GROWTH
Culture: NO GROWTH
Special Requests: ADEQUATE

## 2020-10-06 ENCOUNTER — Other Ambulatory Visit: Payer: Self-pay | Admitting: Urology

## 2020-10-12 ENCOUNTER — Other Ambulatory Visit (HOSPITAL_COMMUNITY): Payer: Self-pay | Admitting: Urology

## 2020-10-12 DIAGNOSIS — C61 Malignant neoplasm of prostate: Secondary | ICD-10-CM

## 2020-11-28 ENCOUNTER — Ambulatory Visit (HOSPITAL_COMMUNITY): Admission: RE | Admit: 2020-11-28 | Payer: BC Managed Care – PPO | Source: Ambulatory Visit

## 2020-11-28 ENCOUNTER — Ambulatory Visit (HOSPITAL_BASED_OUTPATIENT_CLINIC_OR_DEPARTMENT_OTHER): Admit: 2020-11-28 | Payer: BC Managed Care – PPO | Admitting: Urology

## 2020-11-28 ENCOUNTER — Encounter (HOSPITAL_BASED_OUTPATIENT_CLINIC_OR_DEPARTMENT_OTHER): Payer: Self-pay

## 2020-11-28 ENCOUNTER — Other Ambulatory Visit (HOSPITAL_COMMUNITY): Payer: Self-pay | Admitting: Urology

## 2020-11-28 DIAGNOSIS — C61 Malignant neoplasm of prostate: Secondary | ICD-10-CM

## 2020-11-28 SURGERY — BIOPSY, PROSTATE, RECTAL APPROACH, WITH US GUIDANCE
Anesthesia: General

## 2021-03-16 ENCOUNTER — Other Ambulatory Visit: Payer: Self-pay | Admitting: Urology

## 2021-04-11 ENCOUNTER — Other Ambulatory Visit: Payer: Self-pay

## 2021-04-11 ENCOUNTER — Encounter (HOSPITAL_BASED_OUTPATIENT_CLINIC_OR_DEPARTMENT_OTHER): Payer: Self-pay | Admitting: Urology

## 2021-04-11 NOTE — Progress Notes (Addendum)
Spoke w/ via phone for pre-op interview---pt Lab needs dos----ISTAT             Lab results------09/13/20 EKG in chart & Epic (EKG showed abnormal R wave progression. Per Dr. Kalman Shan MDA on 04/11/21, the EKG does not need to be repeated.) COVID test -----patient states asymptomatic no test needed Arrive at -------0645 on 04/18/21 NPO after MN NO Solid Food.  Clear liquids from MN until---0545 Med rec completed Medications to take morning of surgery -----Amlodipine, Antibiotic (pt stated that the doctor ordered an antibiotic to take before surgery, but he doesn't remember what it is) Diabetic medication -----n/a Patient instructed no nail polish to be worn day of surgery Patient instructed to bring photo id and insurance card day of surgery Patient aware to have Driver (ride ) / caregiver    for 24 hours after surgery - wife Patrick Everett Patient Special Instructions -----Fleet's enema at least one hour before leaving home on the morning of surgery. Extended stay instructions given. Pre-Op special Istructions -----none Patient verbalized understanding of instructions that were given at this phone interview. Patient denies shortness of breath, chest pain, fever, cough at this phone interview.

## 2021-04-18 ENCOUNTER — Observation Stay (HOSPITAL_BASED_OUTPATIENT_CLINIC_OR_DEPARTMENT_OTHER)
Admission: RE | Admit: 2021-04-18 | Discharge: 2021-04-19 | Disposition: A | Discharge status: Home / Self Care | Payer: BC Managed Care – PPO | Attending: Urology | Admitting: Urology

## 2021-04-18 ENCOUNTER — Encounter (HOSPITAL_BASED_OUTPATIENT_CLINIC_OR_DEPARTMENT_OTHER): Payer: Self-pay | Admitting: Urology

## 2021-04-18 ENCOUNTER — Ambulatory Visit (HOSPITAL_BASED_OUTPATIENT_CLINIC_OR_DEPARTMENT_OTHER): Payer: BC Managed Care – PPO | Admitting: Anesthesiology

## 2021-04-18 ENCOUNTER — Ambulatory Visit (HOSPITAL_COMMUNITY)
Admission: RE | Admit: 2021-04-18 | Discharge: 2021-04-18 | Disposition: A | Discharge status: Home / Self Care | Payer: BC Managed Care – PPO | Source: Ambulatory Visit | Attending: Urology | Admitting: Urology

## 2021-04-18 ENCOUNTER — Encounter (HOSPITAL_BASED_OUTPATIENT_CLINIC_OR_DEPARTMENT_OTHER)
Admission: RE | Disposition: A | Discharge status: Home / Self Care | Payer: Self-pay | Source: Home / Self Care | Attending: Urology

## 2021-04-18 DIAGNOSIS — R338 Other retention of urine: Secondary | ICD-10-CM | POA: Insufficient documentation

## 2021-04-18 DIAGNOSIS — C61 Malignant neoplasm of prostate: Secondary | ICD-10-CM

## 2021-04-18 DIAGNOSIS — I10 Essential (primary) hypertension: Secondary | ICD-10-CM | POA: Diagnosis not present

## 2021-04-18 DIAGNOSIS — N138 Other obstructive and reflux uropathy: Secondary | ICD-10-CM | POA: Diagnosis present

## 2021-04-18 DIAGNOSIS — N401 Enlarged prostate with lower urinary tract symptoms: Secondary | ICD-10-CM | POA: Insufficient documentation

## 2021-04-18 HISTORY — PX: PROSTATE BIOPSY: SHX241

## 2021-04-18 HISTORY — PX: TRANSURETHRAL RESECTION OF PROSTATE: SHX73

## 2021-04-18 LAB — BASIC METABOLIC PANEL
Anion gap: 6 (ref 5–15)
BUN: 12 mg/dL (ref 8–23)
CO2: 24 mmol/L (ref 22–32)
Calcium: 8 mg/dL — ABNORMAL LOW (ref 8.9–10.3)
Chloride: 107 mmol/L (ref 98–111)
Creatinine, Ser: 1.08 mg/dL (ref 0.61–1.24)
GFR, Estimated: >60 mL/min (ref >60)
Glucose, Bld: 174 mg/dL — ABNORMAL HIGH (ref 70–99)
Potassium: 3.6 mmol/L (ref 3.5–5.1)
Sodium: 137 mmol/L (ref 135–145)

## 2021-04-18 LAB — POCT I-STAT, CHEM 8
BUN: 16 mg/dL (ref 8–23)
Calcium, Ion: 0.99 mmol/L — ABNORMAL LOW (ref 1.15–1.40)
Chloride: 106 mmol/L (ref 98–111)
Creatinine, Ser: 1.2 mg/dL (ref 0.61–1.24)
Glucose, Bld: 93 mg/dL (ref 70–99)
HCT: 44 % (ref 39.0–52.0)
Hemoglobin: 15 g/dL (ref 13.0–17.0)
Potassium: 5.9 mmol/L — ABNORMAL HIGH (ref 3.5–5.1)
Sodium: 137 mmol/L (ref 135–145)
TCO2: 27 mmol/L (ref 22–32)

## 2021-04-18 LAB — HEMOGLOBIN AND HEMATOCRIT, BLOOD
HCT: 36.6 % — ABNORMAL LOW (ref 39.0–52.0)
Hemoglobin: 11 g/dL — ABNORMAL LOW (ref 13.0–17.0)

## 2021-04-18 SURGERY — BIOPSY, PROSTATE, RECTAL APPROACH, WITH US GUIDANCE
Anesthesia: General | Site: Prostate

## 2021-04-18 MED ORDER — SUCCINYLCHOLINE CHLORIDE 200 MG/10ML IV SOSY
PREFILLED_SYRINGE | INTRAVENOUS | Status: DC | PRN
  Administered 2021-04-18: 20 mg via INTRAVENOUS

## 2021-04-18 MED ORDER — LIDOCAINE 2% (20 MG/ML) 5 ML SYRINGE
INTRAMUSCULAR | Status: DC | PRN
Start: 2021-04-18 — End: 2021-04-18
  Administered 2021-04-18: 80 mg via INTRAVENOUS

## 2021-04-18 MED ORDER — LIDOCAINE HCL (PF) 2 % IJ SOLN
INTRAMUSCULAR | Status: AC
  Filled 2021-04-18: qty 5

## 2021-04-18 MED ORDER — MORPHINE SULFATE (PF) 4 MG/ML IV SOLN: 2.0000 mg | INTRAVENOUS | Status: DC | PRN

## 2021-04-18 MED ORDER — SUCCINYLCHOLINE CHLORIDE 200 MG/10ML IV SOSY
PREFILLED_SYRINGE | INTRAVENOUS | Status: AC
  Filled 2021-04-18: qty 10

## 2021-04-18 MED ORDER — OXYBUTYNIN CHLORIDE 5 MG PO TABS: 5.0000 mg | ORAL_TABLET | Freq: Three times a day (TID) | ORAL | Status: DC | PRN

## 2021-04-18 MED ORDER — CIPROFLOXACIN IN D5W 400 MG/200ML IV SOLN
INTRAVENOUS | Status: AC
  Filled 2021-04-18: qty 200

## 2021-04-18 MED ORDER — FENTANYL CITRATE (PF) 250 MCG/5ML IJ SOLN
INTRAMUSCULAR | Status: AC
  Filled 2021-04-18: qty 5

## 2021-04-18 MED ORDER — WHITE PETROLATUM EX OINT
TOPICAL_OINTMENT | CUTANEOUS | Status: AC
  Filled 2021-04-18: qty 5

## 2021-04-18 MED ORDER — FLEET ENEMA 7-19 GM/118ML RE ENEM: 1.0000 | ENEMA | Freq: Once | RECTAL | Status: DC

## 2021-04-18 MED ORDER — SODIUM CHLORIDE 0.9 % IR SOLN
Status: DC | PRN
  Administered 2021-04-18 (×7): 6000 mL

## 2021-04-18 MED ORDER — DIPHENHYDRAMINE HCL 12.5 MG/5ML PO ELIX: 12.5000 mg | ORAL_SOLUTION | Freq: Four times a day (QID) | ORAL | Status: DC | PRN

## 2021-04-18 MED ORDER — PHENYLEPHRINE 40 MCG/ML (10ML) SYRINGE FOR IV PUSH (FOR BLOOD PRESSURE SUPPORT)
PREFILLED_SYRINGE | INTRAVENOUS | Status: AC
  Filled 2021-04-18: qty 10

## 2021-04-18 MED ORDER — FENTANYL CITRATE (PF) 100 MCG/2ML IJ SOLN
INTRAMUSCULAR | Status: AC
  Filled 2021-04-18: qty 2

## 2021-04-18 MED ORDER — HYDROCODONE-ACETAMINOPHEN 5-325 MG PO TABS
1.0000 | ORAL_TABLET | ORAL | Status: DC | PRN
  Administered 2021-04-18 – 2021-04-19 (×3): 1 via ORAL

## 2021-04-18 MED ORDER — HYDROCODONE-ACETAMINOPHEN 5-325 MG PO TABS
ORAL_TABLET | ORAL | Status: AC
  Filled 2021-04-18: qty 1

## 2021-04-18 MED ORDER — EPHEDRINE SULFATE-NACL 50-0.9 MG/10ML-% IV SOSY
PREFILLED_SYRINGE | INTRAVENOUS | Status: DC | PRN
  Administered 2021-04-18: 5 mg via INTRAVENOUS
  Administered 2021-04-18: 10 mg via INTRAVENOUS
  Administered 2021-04-18: 5 mg via INTRAVENOUS
  Administered 2021-04-18: 10 mg via INTRAVENOUS

## 2021-04-18 MED ORDER — CIPROFLOXACIN IN D5W 400 MG/200ML IV SOLN
400.0000 mg | Freq: Two times a day (BID) | INTRAVENOUS | Status: DC
  Administered 2021-04-18 – 2021-04-19 (×2): 400 mg via INTRAVENOUS

## 2021-04-18 MED ORDER — BACITRACIN-NEOMYCIN-POLYMYXIN OINTMENT TUBE
TOPICAL_OINTMENT | CUTANEOUS | Status: AC
  Filled 2021-04-18: qty 14.17

## 2021-04-18 MED ORDER — ONDANSETRON HCL 4 MG/2ML IJ SOLN: 4.0000 mg | INTRAMUSCULAR | Status: DC | PRN

## 2021-04-18 MED ORDER — DIPHENHYDRAMINE HCL 50 MG/ML IJ SOLN: 12.5000 mg | Freq: Four times a day (QID) | INTRAMUSCULAR | Status: DC | PRN

## 2021-04-18 MED ORDER — ACETAMINOPHEN 500 MG PO TABS
ORAL_TABLET | ORAL | Status: AC
  Filled 2021-04-18: qty 2

## 2021-04-18 MED ORDER — FENTANYL CITRATE (PF) 100 MCG/2ML IJ SOLN
INTRAMUSCULAR | Status: DC | PRN
  Administered 2021-04-18 (×3): 50 ug via INTRAVENOUS
  Administered 2021-04-18: 25 ug via INTRAVENOUS
  Administered 2021-04-18: 50 ug via INTRAVENOUS
  Administered 2021-04-18: 25 ug via INTRAVENOUS
  Administered 2021-04-18: 50 ug via INTRAVENOUS

## 2021-04-18 MED ORDER — ONDANSETRON HCL 4 MG/2ML IJ SOLN
INTRAMUSCULAR | Status: DC | PRN
Start: 2021-04-18 — End: 2021-04-18
  Administered 2021-04-18: 4 mg via INTRAVENOUS

## 2021-04-18 MED ORDER — PROPOFOL 10 MG/ML IV BOLUS
INTRAVENOUS | Status: AC
  Filled 2021-04-18: qty 20

## 2021-04-18 MED ORDER — PROPOFOL 10 MG/ML IV BOLUS
INTRAVENOUS | Status: DC | PRN
  Administered 2021-04-18: 40 mg via INTRAVENOUS
  Administered 2021-04-18: 20 mg via INTRAVENOUS
  Administered 2021-04-18: 50 mg via INTRAVENOUS
  Administered 2021-04-18: 30 mg via INTRAVENOUS
  Administered 2021-04-18: 20 mg via INTRAVENOUS
  Administered 2021-04-18: 50 mg via INTRAVENOUS

## 2021-04-18 MED ORDER — PROPOFOL 500 MG/50ML IV EMUL
INTRAVENOUS | Status: DC | PRN
  Administered 2021-04-18: 200 ug/kg/min via INTRAVENOUS

## 2021-04-18 MED ORDER — CIPROFLOXACIN IN D5W 400 MG/200ML IV SOLN
400.0000 mg | INTRAVENOUS | Status: AC
  Administered 2021-04-18: 400 mg via INTRAVENOUS

## 2021-04-18 MED ORDER — KETOROLAC TROMETHAMINE 15 MG/ML IJ SOLN: 15.0000 mg | Freq: Once | INTRAMUSCULAR | Status: DC | PRN

## 2021-04-18 MED ORDER — ACETAMINOPHEN 325 MG PO TABS: 650.0000 mg | ORAL_TABLET | ORAL | Status: DC | PRN

## 2021-04-18 MED ORDER — DEXAMETHASONE SODIUM PHOSPHATE 10 MG/ML IJ SOLN
INTRAMUSCULAR | Status: DC | PRN
  Administered 2021-04-18: 10 mg via INTRAVENOUS

## 2021-04-18 MED ORDER — SODIUM CHLORIDE 0.9 % IV SOLN: INTRAVENOUS | Status: DC

## 2021-04-18 MED ORDER — AMLODIPINE BESYLATE 10 MG PO TABS
10.0000 mg | ORAL_TABLET | Freq: Every day | ORAL | Status: DC
Start: 2021-04-19 — End: 2021-04-19
  Filled 2021-04-18: qty 1

## 2021-04-18 MED ORDER — SODIUM CHLORIDE 0.9 % IR SOLN
3000.0000 mL | Status: DC
  Administered 2021-04-19: 3000 mL

## 2021-04-18 MED ORDER — AMISULPRIDE (ANTIEMETIC) 5 MG/2ML IV SOLN: 10.0000 mg | Freq: Once | INTRAVENOUS | Status: DC | PRN

## 2021-04-18 MED ORDER — ACETAMINOPHEN 500 MG PO TABS
1000.0000 mg | ORAL_TABLET | Freq: Once | ORAL | Status: AC
  Administered 2021-04-18: 1000 mg via ORAL

## 2021-04-18 MED ORDER — PHENYLEPHRINE 40 MCG/ML (10ML) SYRINGE FOR IV PUSH (FOR BLOOD PRESSURE SUPPORT)
PREFILLED_SYRINGE | INTRAVENOUS | Status: DC | PRN
  Administered 2021-04-18 (×2): 120 ug via INTRAVENOUS
  Administered 2021-04-18 (×2): 80 ug via INTRAVENOUS
  Administered 2021-04-18: 120 ug via INTRAVENOUS

## 2021-04-18 MED ORDER — FENTANYL CITRATE (PF) 100 MCG/2ML IJ SOLN: 25.0000 ug | INTRAMUSCULAR | Status: DC | PRN

## 2021-04-18 MED ORDER — LACTATED RINGERS IV SOLN: INTRAVENOUS | Status: DC

## 2021-04-18 MED ORDER — BELLADONNA ALKALOIDS-OPIUM 16.2-60 MG RE SUPP: 1.0000 | Freq: Four times a day (QID) | RECTAL | Status: DC | PRN

## 2021-04-18 MED ORDER — EPHEDRINE 5 MG/ML INJ
INTRAVENOUS | Status: AC
  Filled 2021-04-18: qty 5

## 2021-04-18 MED ORDER — ONDANSETRON HCL 4 MG/2ML IJ SOLN
INTRAMUSCULAR | Status: AC
  Filled 2021-04-18: qty 2

## 2021-04-18 MED ORDER — MIDAZOLAM HCL 5 MG/5ML IJ SOLN
INTRAMUSCULAR | Status: DC | PRN
Start: 2021-04-18 — End: 2021-04-18
  Administered 2021-04-18: 2 mg via INTRAVENOUS

## 2021-04-18 MED ORDER — DEXAMETHASONE SODIUM PHOSPHATE 10 MG/ML IJ SOLN
INTRAMUSCULAR | Status: AC
  Filled 2021-04-18: qty 1

## 2021-04-18 MED ORDER — MIDAZOLAM HCL 2 MG/2ML IJ SOLN
INTRAMUSCULAR | Status: AC
  Filled 2021-04-18: qty 2

## 2021-04-18 MED ORDER — ONDANSETRON HCL 4 MG/2ML IJ SOLN: 4.0000 mg | Freq: Once | INTRAMUSCULAR | Status: DC | PRN

## 2021-04-18 MED ORDER — PROPOFOL 500 MG/50ML IV EMUL
INTRAVENOUS | Status: AC
  Filled 2021-04-18: qty 50

## 2021-04-18 SURGICAL SUPPLY — 34 items
BAG DRAIN URO-CYSTO SKYTR STRL (DRAIN) ×2 IMPLANT
BAG DRN RND TRDRP ANRFLXCHMBR (UROLOGICAL SUPPLIES) ×1
BAG DRN UROCATH (DRAIN) ×1
BAG URINE DRAIN 2000ML AR STRL (UROLOGICAL SUPPLIES) ×2 IMPLANT
BAND INSRT 18 STRL LF DISP RB (MISCELLANEOUS)
BAND RUBBER #18 3X1/16 STRL (MISCELLANEOUS) IMPLANT
CATH FOLEY 3WAY 30CC 22FR (CATHETERS) IMPLANT
CATH FOLEY 3WAY 30CC 24FR (CATHETERS) ×2
CATH URTH STD 24FR FL 3W 2 (CATHETERS) IMPLANT
CLOTH BEACON ORANGE TIMEOUT ST (SAFETY) ×2 IMPLANT
GLOVE SURG ENC MOIS LTX SZ7.5 (GLOVE) ×2 IMPLANT
GOWN STRL REUS W/TWL XL LVL3 (GOWN DISPOSABLE) ×2 IMPLANT
HOLDER FOLEY CATH W/STRAP (MISCELLANEOUS) IMPLANT
INST BIOPSY MAXCORE 18GX25 (NEEDLE) ×1 IMPLANT
INSTR BIOPSY MAXCORE 18GX20 (NEEDLE) IMPLANT
IV NS IRRIG 3000ML ARTHROMATIC (IV SOLUTION) ×16 IMPLANT
KIT TURNOVER CYSTO (KITS) ×2 IMPLANT
LOOP CUT BIPOLAR 24F LRG (ELECTROSURGICAL) ×2 IMPLANT
MANIFOLD NEPTUNE II (INSTRUMENTS) ×2 IMPLANT
NDL SAFETY ECLIPSE 18X1.5 (NEEDLE) IMPLANT
NDL SPNL 22GX7 QUINCKE BK (NEEDLE) ×1 IMPLANT
NEEDLE HYPO 18GX1.5 SHARP (NEEDLE)
NEEDLE SPNL 22GX7 QUINCKE BK (NEEDLE) ×2 IMPLANT
NS IRRIG 500ML POUR BTL (IV SOLUTION) ×2 IMPLANT
PACK CYSTO (CUSTOM PROCEDURE TRAY) ×2 IMPLANT
PAD PREP 24X48 CUFFED NSTRL (MISCELLANEOUS) ×2 IMPLANT
PIN SAFETY STERILE (MISCELLANEOUS) ×2 IMPLANT
SYR 30ML LL (SYRINGE) ×2 IMPLANT
SYR CONTROL 10ML LL (SYRINGE) IMPLANT
SYR TOOMEY IRRIG 70ML (MISCELLANEOUS) ×2
SYRINGE TOOMEY IRRIG 70ML (MISCELLANEOUS) ×1 IMPLANT
TUBE CONNECTING 12X1/4 (SUCTIONS) ×2 IMPLANT
TUBING UROLOGY SET (TUBING) ×2 IMPLANT
UNDERPAD 30X36 HEAVY ABSORB (UNDERPADS AND DIAPERS) ×4 IMPLANT

## 2021-04-18 NOTE — Anesthesia Procedure Notes (Signed)
Procedure Name: LMA Insertion Date/Time: 04/18/2021 9:04 AM Performed by: Rogers Blocker, CRNA Pre-anesthesia Checklist: Patient identified, Emergency Drugs available, Suction available and Patient being monitored Patient Re-evaluated:Patient Re-evaluated prior to induction Oxygen Delivery Method: Circle System Utilized Preoxygenation: Pre-oxygenation with 100% oxygen Induction Type: IV induction Ventilation: Mask ventilation without difficulty LMA: LMA inserted LMA Size: 5.0 Number of attempts: 1 Airway Equipment and Method: Bite block Placement Confirmation: positive ETCO2 Tube secured with: Tape Dental Injury: Teeth and Oropharynx as per pre-operative assessment

## 2021-04-18 NOTE — Anesthesia Postprocedure Evaluation (Signed)
Anesthesia Post Note  Patient: Patrick Everett  Procedure(s) Performed: BIOPSY TRANSRECTAL ULTRASONIC PROSTATE (TUBP) (Prostate) PROSTATE BIOPSY (Prostate) TRANSURETHRAL RESECTION OF THE PROSTATE (TURP), BIPOLAR (Prostate)     Patient location during evaluation: PACU Anesthesia Type: General Level of consciousness: awake Pain management: pain level controlled Vital Signs Assessment: post-procedure vital signs reviewed and stable Respiratory status: spontaneous breathing, nonlabored ventilation, respiratory function stable and patient connected to nasal cannula oxygen Cardiovascular status: blood pressure returned to baseline and stable Postop Assessment: no apparent nausea or vomiting Anesthetic complications: no   No notable events documented.  Last Vitals:  Vitals:   04/18/21 1140 04/18/21 1230  BP: 99/73 116/72  Pulse: 67 64  Resp: 13   Temp: (!) 36.4 C   SpO2: 98%     Last Pain:  Vitals:   04/18/21 1230  TempSrc:   PainSc: 0-No pain                 Claudie Rathbone P Phyillis Dascoli

## 2021-04-18 NOTE — Transfer of Care (Signed)
Immediate Anesthesia Transfer of Care Note  Patient: Patrick Everett  Procedure(s) Performed: BIOPSY TRANSRECTAL ULTRASONIC PROSTATE (TUBP) (Prostate) PROSTATE BIOPSY (Prostate) TRANSURETHRAL RESECTION OF THE PROSTATE (TURP), BIPOLAR (Prostate)  Patient Location: PACU  Anesthesia Type:General  Level of Consciousness: drowsy and responds to stimulation  Airway & Oxygen Therapy: Patient Spontanous Breathing and Patient connected to face mask oxygen  Post-op Assessment: Report given to RN and Post -op Vital signs reviewed and stable  Post vital signs: Reviewed and stable  Last Vitals:  Vitals Value Taken Time  BP 103/76 04/18/21 1101  Temp    Pulse    Resp 20 04/18/21 1103  SpO2    Vitals shown include unvalidated device data.  Last Pain:  Vitals:   04/18/21 0747  TempSrc: Oral  PainSc: 0-No pain      Patients Stated Pain Goal: 4 (37/85/88 5027)  Complications: No notable events documented.

## 2021-04-18 NOTE — Progress Notes (Signed)
04/18/2021 12:52 PM Dr. Lovena Neighbours paged and made aware of post op H/H results as well as BMET results. No new orders at this time.  Paytin Ramakrishnan, Arville Lime

## 2021-04-18 NOTE — Anesthesia Preprocedure Evaluation (Addendum)
Anesthesia Evaluation  Patient identified by MRN, date of birth, ID band Patient awake    Reviewed: Allergy & Precautions, NPO status , Patient's Chart, lab work & pertinent test results  Airway Mallampati: II  TM Distance: >3 FB Neck ROM: Full    Dental no notable dental hx.    Pulmonary neg pulmonary ROS,    Pulmonary exam normal breath sounds clear to auscultation       Cardiovascular hypertension, Pt. on medications Normal cardiovascular exam Rhythm:Regular Rate:Normal  ECG: ST, rate 106   Neuro/Psych negative neurological ROS  negative psych ROS   GI/Hepatic negative GI ROS, Neg liver ROS,   Endo/Other  negative endocrine ROS  Renal/GU Renal disease     Musculoskeletal negative musculoskeletal ROS (+)   Abdominal   Peds  Hematology negative hematology ROS (+)   Anesthesia Other Findings BENIGN PROSTATIC HYPERPLASIA  Reproductive/Obstetrics                            Anesthesia Physical Anesthesia Plan  ASA: 2  Anesthesia Plan: General   Post-op Pain Management:    Induction: Intravenous  PONV Risk Score and Plan: 3 and Ondansetron, Dexamethasone, Midazolam and Treatment may vary due to age or medical condition  Airway Management Planned: LMA  Additional Equipment:   Intra-op Plan:   Post-operative Plan: Extubation in OR  Informed Consent: I have reviewed the patients History and Physical, chart, labs and discussed the procedure including the risks, benefits and alternatives for the proposed anesthesia with the patient or authorized representative who has indicated his/her understanding and acceptance.     Dental advisory given  Plan Discussed with: CRNA  Anesthesia Plan Comments:        Anesthesia Quick Evaluation

## 2021-04-18 NOTE — Op Note (Signed)
Operative Note  Preoperative diagnosis:  1.  Gleason 6 prostate cancer 2.  BPH with urinary retention  Postoperative diagnosis: 1.  Gleason 6 prostate cancer 2.  BPH with urinary retention  Procedure(s): 1.  Transrectal ultrasound-guided prostate biopsy 2.  Bipolar TURP  Surgeon: Ellison Hughs, MD  Assistants:  None  Anesthesia:  General  Complications:  None  EBL: 250 mL  Specimens: 1.  Prostate biopsy specimens 2.  Prostate chips  Drains/Catheters: 1.  24 French three-way Foley catheter with 30 mL of sterile water in the balloon  Intraoperative findings:   Prostate volume of approximately 120 cm Prominent median lobe that was the primary focus of TURP  Indication:  Patrick Everett is a 66 y.o. male with a history of Gleason 6 prostate cancer as well as BPH with urinary retention requiring an indwelling Foley catheter.  The patient has been consented for the above procedures, voices understanding and wishes to proceed.  Description of procedure:  After informed consent was obtained, the patient was brought to the operating room and general anesthesia was administered.  The patient was placed in the left lateral decubitus position and prepped in the usual fashion.  A transrectal ultrasound probe was then inserted into the rectum.  Prostate dimensions were obtained with a prostate volume of approximately 120 cm.  A total of 12 core needle biopsies within obtained from each sextant of the prostate.  The transrectal ultrasound probe was then removed with no significant rectal bleeding.  The patient was then repositioned in the dorsolithotomy position and prepped and draped in usual sterile fashion.  A timeout was performed. A 23 French rigid cystoscope was then inserted into the urethral meatus and advanced into the bladder under direct vision. A complete bladder survey revealed a bulbous and prominent median lobe with a large intravesical component.  Both ureteral orifices  were identified and well away from the bladder neck.  The rigid cystoscope was then exchanged for a 26 French resectoscope with a bipolar loop working element.  Starting at the median lobe/bladder neck and progressing distally to the verumontanum, the prostatic adenoma was systematically resected until a widely patent prostatic urethral channel was created.  All prostate chips were then hand irrigated out of the bladder and sent to pathology for permanent section.  The resectoscope was then removed and exchanged for a 24 [place] three-way Foley catheter.  The three-way Foley catheter was then extensively hand irrigated until the irrigant returned clear to light pink.  The catheter was then placed to continuous bladder irrigation and placed on rubber band traction.  He tolerated the procedure well and was transferred to the postanesthesia unit in stable condition.  Plan:  CBI and traction overnight

## 2021-04-18 NOTE — H&P (Signed)
PRE-OP H&P  Office Visit Report     04/12/2021   --------------------------------------------------------------------------------   Patrick Everett  MRN: [id number]  DOB: 1956-02-26, 66 year old Male  PRIMARY CARE:  Kristie Cowman, MD  REFERRING:  Daine Gravel, NP  PROVIDER:  Ellison Hughs, M.D.  TREATING:  Jiles Crocker, NP  LOCATION:  [facility] [phone]     --------------------------------------------------------------------------------   CC/HPI: Prostate cancer   HPI: Patrick Everett is a 66 year old male with T1c, Gleason 6 prostate cancer and BPH with urinary retention.   Last PSA: 24.4 (09/2020), 19.60 (08/2019), 15.02 (08/11/2019), 16.16 (08/13/2019)  Biopsy Date: 11/02/19  TNM stage: T1c  Gleason score: 3+3  Left: NED  Right: G6 at the lateral mid gland (5%)  Prostate volume: 119 cm^3   09/13/2020: 66 year old male who presents today for a voiding trial. He has the above noted past medical history. He presented to the emergency department on 08/29/2020 with complaints of nausea, vomiting, diarrhea and abdominal pain. Was found to be in urinary retention as well as had an acute kidney injury. He reports that he has never stop taking Flomax however within the hospital notes it was stated that he was off of this for some time. He is currently on Flomax. His initial creatinine was 3.41 however this did improve after diuresis and at discharge it was 1.3. He failed 2 voiding trials in the hospital. His renal ultrasound was normal. He believes his urinary difficulty came due to food poisoning. He reports that he had no trouble urinating prior to this event. He is very unhappy with a Foley catheter would like it removed today. Advised we may proceed with voiding trial.   09/28/20: The patient is here today for a voiding trial after multiple failed attempts. He reports catheter related discomfort, but denies hematuria, suprapubic pain or fever/chills. He is accompanied by his  wife during today's visit.   11/16/20: The patient is here today for a routine follow-up. He reports leakage around his Foley catheter for the past several days. He denies malodorous urine, suprapubic discomfort or penile discharge. He states that he would like to delay surgery in hopes of regaining bladder function on his own, despite multiple failed voiding trials. He continues to take tamsulosin twice daily without any associated side effects.   01/12/2021: 66 year old male who presents today with inability to void since last night. He reports he was supposed to return for Foley catheter placement if unable to urinate but he was urinating well and did not feel he needed it replaced. Throughout the course of the evening he developed painful inability to urinate and presents today with a distended bladder.   03/01/21: Above history noted. The patient is here today for a routine follow-up and catheter exchange. He is finally ready to proceed with a TURP in hopes of alleviating his ongoing urinary retention. He reports mild discomfort from his Foley and occasionally has leakage of urine around the catheter. Denies interval UTIs, dysuria or hematuria.   04/12/2021: Here today for preoperative appointment prior to undergoing TURP and surveillance prostate biopsy on 1/18 with his urologist. Also due for catheter exchange. He is scheduled to start Cipro approximately 3 days prior to the procedure as well. Denies any changes to past medical history, prescription medications taken on a daily basis, no interval surgical or procedural intervention. Despite some mild discomfort and irritative symptoms, he is tolerating catheter well endorsing good urine output. He has had no interval UTI treatment  or gross hematuria. Denies any interval chest pain, shortness of breath, lightheadedness or dizziness, numbness or tingling in extremities. Denies any interval fevers or chills, nausea/vomiting.     ALLERGIES: None    MEDICATIONS: A And D ointment apply to affected area three times per day  Amlodipine Besylate 5 mg tablet  Vitamin D2     GU PSH: Hernia Repair W/mesh Prostate Needle Biopsy - 11/02/2019       PSH Notes: Wisdom teeth    NON-GU PSH: Surgical Pathology, Gross And Microscopic Examination For Prostate Needle - 11/02/2019     GU PMH: BPH w/LUTS - 03/01/2021, - 09/28/2020 Prostate Cancer - 03/01/2021, - 11/16/2020, - 09/28/2020, - 05/12/2020, Unfavorable, intermediate risk, Grade 1 prostate cancer. , - 11/10/2019 Urinary Retention - 03/01/2021, - 01/12/2021, - 01/11/2021, - 11/23/2020, - 11/16/2020, - 10/26/2020, - 09/28/2020, - 09/13/2020 Elevated PSA - 11/16/2020, - 09/27/2019 BPH w/o LUTS - 05/12/2020, - 09/27/2019    NON-GU PMH: Hypertension    FAMILY HISTORY: 2 daughters - No Family History 1 son - No Family History Dementia - Father Diabetes - Brother Heart Disease - Mother Kidney Failure - Brother   SOCIAL HISTORY: Marital Status: Married Preferred Language: English; Race: Black or African American Current Smoking Status: Patient has never smoked.   Tobacco Use Assessment Completed: Used Tobacco in last 30 days? Has never drank.  Drinks 2 caffeinated drinks per day.    REVIEW OF SYSTEMS:    GU Review Male:   Patient denies frequent urination, hard to postpone urination, burning/ pain with urination, get up at night to urinate, leakage of urine, stream starts and stops, trouble starting your stream, have to strain to urinate , erection problems, and penile pain.  Gastrointestinal (Upper):   Patient denies nausea, vomiting, and indigestion/ heartburn.  Gastrointestinal (Lower):   Patient denies diarrhea and constipation.  Constitutional:   Patient denies fever, night sweats, weight loss, and fatigue.  Skin:   Patient denies skin rash/ lesion and itching.  Eyes:   Patient denies blurred vision and double vision.  Ears/ Nose/ Throat:   Patient denies sore throat and sinus problems.   Hematologic/Lymphatic:   Patient denies swollen glands and easy bruising.  Cardiovascular:   Patient denies leg swelling and chest pains.  Respiratory:   Patient denies cough and shortness of breath.  Endocrine:   Patient denies excessive thirst.  Musculoskeletal:   Patient denies back pain and joint pain.  Neurological:   Patient denies headaches and dizziness.  Psychologic:   Patient denies depression and anxiety.   VITAL SIGNS:      04/12/2021 01:39 PM  Weight 170 lb / 77.11 kg  Height 69 in / 175.26 cm  BP 130/82 mmHg  Pulse 74 /min  Temperature 97.5 F / 36.3 C  BMI 25.1 kg/m   GU PHYSICAL EXAMINATION:    Penis: Penile foley catheter present draining grossly clear-yellow urine.   MULTI-SYSTEM PHYSICAL EXAMINATION:    Constitutional: Well-nourished. No physical deformities. Normally developed. Good grooming.   Neck: Neck symmetrical, not swollen. Normal tracheal position.  Respiratory: No labored breathing, no use of accessory muscles.   Cardiovascular: Normal temperature, normal extremity pulses, no swelling, no varicosities.  Skin: No paleness, no jaundice, no cyanosis. No lesion, no ulcer, no rash.  Neurologic / Psychiatric: Oriented to time, oriented to place, oriented to person. No depression, no anxiety, no agitation.  Gastrointestinal: No mass, no tenderness, no rigidity, non obese abdomen.  Musculoskeletal: Normal gait and station of head and  neck.     Complexity of Data:  Source Of History:  Patient, Medical Record Summary  Lab Test Review:   PSA  Records Review:   Pathology Reports, Previous Doctor Records, Previous Hospital Records, Previous Patient Records  Urine Test Review:   Urinalysis, Urine Culture  Urodynamics Review:   Review Bladder Scan   10/26/20 05/12/20 09/27/19  PSA  Total PSA 24.40 ng/mL 22.40 ng/mL 19.60 ng/mL    PROCEDURES:         Simple Foley Catheterization - 51702  A 16 French Foley catheter was inserted into the bladder using sterile  technique. The patient was taught routine catheter care. A catheter plug was connected. A urine culture was sent to the lab.   ASSESSMENT:      ICD-10 Details  1 GU:   Urinary Retention - R33.8 Chronic, Stable  2   Prostate Cancer - C61 Chronic, Stable   PLAN:           Orders Labs Urine Culture CATH          Schedule Return Visit/Planned Activity: Keep Scheduled Appointment - Follow up MD, Schedule Surgery          Document Letter(s):  Created for Patient: Clinical Summary         Notes:  -The risks, benefits and alternatives of cystoscopy with TURP was discussed with the patient. The risks included, but are not limited to, bleeding, urinary tract infection, bladder perforation requiring prolonged catheterization and/or open bladder repair, ureteral injury, ureteral obstruction, urethral stricture disease, new or worsening voiding dysfunction, retrograde ejaculation, MI, CVA, PE, DVT and the inherent risks of general anesthesia. We also discussed the need for Foley catheterization for at least 3 days post-op and the likely need for post-op observation in the hospital following the procedure. The patient voices understanding and wishes to proceed.   -Will proceed with surveillance prostate biopsy at the same time as his TURP. Risks including bleeding and infection discussed.

## 2021-04-19 DIAGNOSIS — C61 Malignant neoplasm of prostate: Secondary | ICD-10-CM | POA: Diagnosis not present

## 2021-04-19 LAB — BASIC METABOLIC PANEL
Anion gap: 9 (ref 5–15)
BUN: 16 mg/dL (ref 8–23)
CO2: 22 mmol/L (ref 22–32)
Calcium: 8.6 mg/dL — ABNORMAL LOW (ref 8.9–10.3)
Chloride: 103 mmol/L (ref 98–111)
Creatinine, Ser: 1.12 mg/dL (ref 0.61–1.24)
GFR, Estimated: >60 mL/min (ref >60)
Glucose, Bld: 149 mg/dL — ABNORMAL HIGH (ref 70–99)
Potassium: 4.2 mmol/L (ref 3.5–5.1)
Sodium: 134 mmol/L — ABNORMAL LOW (ref 135–145)

## 2021-04-19 LAB — HEMOGLOBIN AND HEMATOCRIT, BLOOD
HCT: 31.7 % — ABNORMAL LOW (ref 39.0–52.0)
Hemoglobin: 9.7 g/dL — ABNORMAL LOW (ref 13.0–17.0)

## 2021-04-19 LAB — SURGICAL PATHOLOGY

## 2021-04-19 MED ORDER — CIPROFLOXACIN IN D5W 400 MG/200ML IV SOLN
INTRAVENOUS | Status: AC
  Filled 2021-04-19: qty 200

## 2021-04-19 MED ORDER — TRAMADOL HCL 50 MG PO TABS: 50.0000 mg | ORAL_TABLET | Freq: Four times a day (QID) | ORAL | 0 refills | Status: AC | PRN

## 2021-04-19 MED ORDER — HYDROCODONE-ACETAMINOPHEN 5-325 MG PO TABS
ORAL_TABLET | ORAL | Status: AC
  Filled 2021-04-19: qty 1

## 2021-04-19 MED ORDER — OXYBUTYNIN CHLORIDE 5 MG PO TABS: 5.0000 mg | ORAL_TABLET | Freq: Three times a day (TID) | ORAL | 1 refills | Status: DC | PRN

## 2021-04-19 MED ORDER — PHENAZOPYRIDINE HCL 200 MG PO TABS: 200.0000 mg | ORAL_TABLET | Freq: Three times a day (TID) | ORAL | 0 refills | Status: AC | PRN

## 2021-04-20 ENCOUNTER — Encounter (HOSPITAL_BASED_OUTPATIENT_CLINIC_OR_DEPARTMENT_OTHER): Payer: Self-pay | Admitting: Urology

## 2021-04-22 ENCOUNTER — Other Ambulatory Visit: Payer: Self-pay

## 2021-04-22 ENCOUNTER — Inpatient Hospital Stay (HOSPITAL_COMMUNITY)
Admission: EM | Admit: 2021-04-22 | Discharge: 2021-04-25 | DRG: 872 | Disposition: A | Discharge status: Home / Self Care | Payer: BC Managed Care – PPO | Attending: Internal Medicine | Admitting: Internal Medicine

## 2021-04-22 DIAGNOSIS — B9620 Unspecified Escherichia coli [E. coli] as the cause of diseases classified elsewhere: Secondary | ICD-10-CM | POA: Diagnosis present

## 2021-04-22 DIAGNOSIS — Z8546 Personal history of malignant neoplasm of prostate: Secondary | ICD-10-CM

## 2021-04-22 DIAGNOSIS — A419 Sepsis, unspecified organism: Secondary | ICD-10-CM | POA: Diagnosis not present

## 2021-04-22 DIAGNOSIS — N179 Acute kidney failure, unspecified: Secondary | ICD-10-CM | POA: Diagnosis not present

## 2021-04-22 DIAGNOSIS — N400 Enlarged prostate without lower urinary tract symptoms: Secondary | ICD-10-CM | POA: Diagnosis present

## 2021-04-22 DIAGNOSIS — N390 Urinary tract infection, site not specified: Secondary | ICD-10-CM

## 2021-04-22 DIAGNOSIS — I10 Essential (primary) hypertension: Secondary | ICD-10-CM | POA: Diagnosis present

## 2021-04-22 DIAGNOSIS — Z79899 Other long term (current) drug therapy: Secondary | ICD-10-CM

## 2021-04-22 DIAGNOSIS — Z8249 Family history of ischemic heart disease and other diseases of the circulatory system: Secondary | ICD-10-CM

## 2021-04-22 DIAGNOSIS — C61 Malignant neoplasm of prostate: Secondary | ICD-10-CM | POA: Diagnosis present

## 2021-04-22 DIAGNOSIS — D649 Anemia, unspecified: Secondary | ICD-10-CM | POA: Diagnosis present

## 2021-04-22 DIAGNOSIS — Z20822 Contact with and (suspected) exposure to covid-19: Secondary | ICD-10-CM | POA: Diagnosis present

## 2021-04-22 MED ORDER — SODIUM CHLORIDE 0.9 % IV SOLN
2.0000 g | Freq: Once | INTRAVENOUS | Status: AC
  Administered 2021-04-22: 2 g via INTRAVENOUS
  Filled 2021-04-22: qty 20

## 2021-04-22 MED ORDER — SODIUM CHLORIDE 0.9 % IV BOLUS
1000.0000 mL | Freq: Once | INTRAVENOUS | Status: AC
  Administered 2021-04-22: 1000 mL via INTRAVENOUS

## 2021-04-22 NOTE — ED Provider Notes (Addendum)
[facility] Emergency Department Provider Note MRN:  [id number]  Arrival date & time: 04/23/21     Chief Complaint   Fever and Chills   History of Present Illness   Patrick Everett is a 66 y.o. year-old male with a history of prostate cancer presenting to the ED with chief complaint of fever and chills.  Patient had prostate biopsy 4 days ago.  This evening having shakes and chills and subjective fever, told to come here by urology team for antibiotics.  Denies any pain, no nausea.  No URI symptoms.  Review of Systems  A thorough review of systems was obtained and all systems are negative except as noted in the HPI and PMH.   Patient's Health History    Past Medical History:  Diagnosis Date   Hypertension    Prostate cancer ([place]) 2021   Stage T1C adenocarcinoma    Past Surgical History:  Procedure Laterality Date   PROSTATE BIOPSY  11/02/2019   transrectal [place] w/ 12 biopsies   PROSTATE BIOPSY N/A 04/18/2021   Procedure: BIOPSY TRANSRECTAL ULTRASONIC PROSTATE (TUBP);  Surgeon: Ceasar Mons, MD;  Location: [facility];  Service: Urology;  Laterality: N/A;   PROSTATE BIOPSY N/A 04/18/2021   Procedure: PROSTATE BIOPSY;  Surgeon: Ceasar Mons, MD;  Location: [facility];  Service: Urology;  Laterality: N/A;   TRANSURETHRAL RESECTION OF PROSTATE N/A 04/18/2021   Procedure: TRANSURETHRAL RESECTION OF THE PROSTATE (TURP), BIPOLAR;  Surgeon: Ceasar Mons, MD;  Location: [facility];  Service: Urology;  Laterality: N/A;   WISDOM TOOTH EXTRACTION     as a teenager   XI ROBOTIC ASSISTED INGUINAL HERNIA REPAIR WITH MESH  2014    Family History  Problem Relation Age of Onset   CAD Mother    Breast cancer Neg Hx    Colon cancer Neg Hx    Pancreatic cancer Neg Hx    Prostate cancer Neg Hx     Social History   Socioeconomic History   Marital status: Married    Spouse name: Not on  file   Number of children: 3   Years of education: Not on file   Highest education level: Not on file  Occupational History    Comment: not working currently  Tobacco Use   Smoking status: Never   Smokeless tobacco: Never  Vaping Use   Vaping Use: Never used  Substance and Sexual Activity   Alcohol use: Yes    Comment: on occasion    Drug use: Never   Sexual activity: Yes  Other Topics Concern   Not on file  Social History Narrative   Two girls and one boy.    Social Determinants of Health   Financial Resource Strain: Not on file  Food Insecurity: Not on file  Transportation Needs: Not on file  Physical Activity: Not on file  Stress: Not on file  Social Connections: Not on file  Intimate Partner Violence: Not on file     Physical Exam   Vitals:   04/23/21 0300 04/23/21 0328  BP: 114/74 106/63  Pulse: 85 88  Resp: (!) 21 18  Temp: (!) 100.8 F (38.2 C) 99 F (37.2 C)  SpO2: 97% 97%    CONSTITUTIONAL: Well-appearing, NAD NEURO/PSYCH:  Alert and oriented x 3, no focal deficits EYES:  eyes equal and reactive ENT/NECK:  no LAD, no JVD CARDIO: Regular rate, well-perfused, normal S1 and S2 PULM:  CTAB no wheezing or  rhonchi GI/GU:  non-distended, non-tender MSK/SPINE:  No gross deformities, no edema SKIN:  no rash, atraumatic   *Additional and/or pertinent findings included in MDM below  Diagnostic and Interventional Summary    EKG Interpretation  Date/Time:    Ventricular Rate:    PR Interval:    QRS Duration:   QT Interval:    QTC Calculation:   R Axis:     Text Interpretation:         Labs Reviewed  CBC - Abnormal; Notable for the following components:      Result Value   WBC 15.5 (*)    Hemoglobin 9.5 (*)    HCT 31.2 (*)    MCV 72.6 (*)    MCH 22.1 (*)    RDW 17.3 (*)    All other components within normal limits  BASIC METABOLIC PANEL - Abnormal; Notable for the following components:   Sodium 133 (*)    Glucose, Bld 144 (*)    BUN 24  (*)    Creatinine, Ser 2.14 (*)    Calcium 8.5 (*)    GFR, Estimated 33 (*)    All other components within normal limits  URINALYSIS, ROUTINE W REFLEX MICROSCOPIC - Abnormal; Notable for the following components:   APPearance HAZY (*)    Hgb urine dipstick LARGE (*)    Protein, ur >=300 (*)    Nitrite POSITIVE (*)    Leukocytes,Ua LARGE (*)    RBC / HPF >50 (*)    WBC, UA >50 (*)    Bacteria, UA FEW (*)    All other components within normal limits  HEPATIC FUNCTION PANEL - Abnormal; Notable for the following components:   Total Protein 5.8 (*)    Albumin 3.0 (*)    All other components within normal limits  BASIC METABOLIC PANEL - Abnormal; Notable for the following components:   Sodium 132 (*)    Glucose, Bld 107 (*)    Creatinine, Ser 1.52 (*)    Calcium 8.0 (*)    GFR, Estimated 50 (*)    All other components within normal limits  CBC WITH DIFFERENTIAL/PLATELET - Abnormal; Notable for the following components:   WBC 14.5 (*)    RBC 4.06 (*)    Hemoglobin 9.0 (*)    HCT 29.1 (*)    MCV 71.7 (*)    MCH 22.2 (*)    RDW 17.2 (*)    Neutro Abs 12.1 (*)    All other components within normal limits  RESP PANEL BY RT-PCR (FLU A&B, COVID) [id number]  URINE CULTURE  CULTURE, BLOOD (ROUTINE X 2)  CULTURE, BLOOD (ROUTINE X 2)  CBC    No orders to display    Medications  oxybutynin (DITROPAN) tablet 5 mg (has no administration in time range)  enoxaparin (LOVENOX) injection 40 mg (has no administration in time range)  lactated ringers infusion ( Intravenous New Bag/Given 04/23/21 0458)  acetaminophen (TYLENOL) tablet 650 mg (has no administration in time range)    Or  acetaminophen (TYLENOL) suppository 650 mg (has no administration in time range)  cefTRIAXone (ROCEPHIN) 2 g in sodium chloride 0.9 % 100 mL IVPB (has no administration in time range)  hydrALAZINE (APRESOLINE) injection 10 mg (has no administration in time range)  sodium chloride 0.9 % bolus 1,000 mL (1,000 mLs  Intravenous New Bag/Given 04/22/21 2348)  cefTRIAXone (ROCEPHIN) 2 g in sodium chloride 0.9 % 100 mL IVPB (2 g Intravenous New Bag/Given 04/22/21 2347)  Procedures  /  Critical Care Procedures  ED Course and Medical Decision Making  Initial Impression and Ddx Concern for possible UTI or postoperative infection of some kind.  Soft blood pressure, mildly tachycardic, providing fluids, antibiotics, awaiting basic labs.  Overall well-appearing.  Will likely touch base with urology for recommendations.  Past medical/surgical history that increases complexity of ED encounter: Prostate cancer  Interpretation of Diagnostics Labs reveal leukocytosis, urinalysis consistent with infection.  Also evidence of AKI given the increase in creatinine.  Patient Reassessment and Ultimate Disposition/Management Discussed case with urology, will admit to medicine.  Patient management required discussion with the following services or consulting groups:  Hospitalist Service and Urology  Complexity of Problems Addressed Acute illness or injury that poses threat of life of bodily function  Additional Data Reviewed and Analyzed Further history obtained from: Further history from spouse/family member  Factors Impacting ED Encounter Risk Consideration of hospitalization  Barth Kirks. Sedonia Small, MD [place] [email or web address]  Final Clinical Impressions(s) / ED Diagnoses     ICD-10-CM   1. Lower urinary tract infectious disease  N39.0     2. AKI (acute kidney injury) ([place])  N17.9       ED Discharge Orders     None        Discharge Instructions Discussed with and Provided to Patient:   Discharge Instructions   None      Maudie Flakes, MD 04/23/21 1308    Maudie Flakes, MD 04/23/21 [phone]

## 2021-04-22 NOTE — ED Triage Notes (Signed)
Pt had surgery on prostate on Wednesday and today started to have chills and fever. Urologist advised he be brought immediately to ER. Pt denies any pain.

## 2021-04-23 ENCOUNTER — Encounter (HOSPITAL_COMMUNITY): Payer: Self-pay | Admitting: Emergency Medicine

## 2021-04-23 DIAGNOSIS — N390 Urinary tract infection, site not specified: Secondary | ICD-10-CM | POA: Diagnosis not present

## 2021-04-23 DIAGNOSIS — N179 Acute kidney failure, unspecified: Secondary | ICD-10-CM | POA: Diagnosis not present

## 2021-04-23 DIAGNOSIS — A419 Sepsis, unspecified organism: Principal | ICD-10-CM

## 2021-04-23 DIAGNOSIS — C61 Malignant neoplasm of prostate: Secondary | ICD-10-CM

## 2021-04-23 LAB — BASIC METABOLIC PANEL
Anion gap: 8 (ref 5–15)
Anion gap: 6 (ref 5–15)
BUN: 24 mg/dL — ABNORMAL HIGH (ref 8–23)
BUN: 23 mg/dL (ref 8–23)
CO2: 24 mmol/L (ref 22–32)
CO2: 24 mmol/L (ref 22–32)
Calcium: 8.5 mg/dL — ABNORMAL LOW (ref 8.9–10.3)
Calcium: 8 mg/dL — ABNORMAL LOW (ref 8.9–10.3)
Chloride: 101 mmol/L (ref 98–111)
Chloride: 102 mmol/L (ref 98–111)
Creatinine, Ser: 2.14 mg/dL — ABNORMAL HIGH (ref 0.61–1.24)
Creatinine, Ser: 1.52 mg/dL — ABNORMAL HIGH (ref 0.61–1.24)
GFR, Estimated: 33 mL/min — ABNORMAL LOW (ref >60)
GFR, Estimated: 50 mL/min — ABNORMAL LOW (ref >60)
Glucose, Bld: 144 mg/dL — ABNORMAL HIGH (ref 70–99)
Glucose, Bld: 107 mg/dL — ABNORMAL HIGH (ref 70–99)
Potassium: 3.9 mmol/L (ref 3.5–5.1)
Potassium: 3.5 mmol/L (ref 3.5–5.1)
Sodium: 133 mmol/L — ABNORMAL LOW (ref 135–145)
Sodium: 132 mmol/L — ABNORMAL LOW (ref 135–145)

## 2021-04-23 LAB — HEPATIC FUNCTION PANEL
ALT: 14 U/L (ref 0–44)
AST: 17 U/L (ref 15–41)
Albumin: 3 g/dL — ABNORMAL LOW (ref 3.5–5.0)
Alkaline Phosphatase: 57 U/L (ref 38–126)
Bilirubin, Direct: 0.1 mg/dL (ref 0.0–0.2)
Indirect Bilirubin: 0.8 mg/dL (ref 0.3–0.9)
Total Bilirubin: 0.9 mg/dL (ref 0.3–1.2)
Total Protein: 5.8 g/dL — ABNORMAL LOW (ref 6.5–8.1)

## 2021-04-23 LAB — CBC WITH DIFFERENTIAL/PLATELET
Abs Immature Granulocytes: 0.05 10*3/uL (ref 0.00–0.07)
Basophils Absolute: 0 10*3/uL (ref 0.0–0.1)
Basophils Relative: 0 %
Eosinophils Absolute: 0 10*3/uL (ref 0.0–0.5)
Eosinophils Relative: 0 %
HCT: 29.1 % — ABNORMAL LOW (ref 39.0–52.0)
Hemoglobin: 9 g/dL — ABNORMAL LOW (ref 13.0–17.0)
Immature Granulocytes: 0 %
Lymphocytes Relative: 10 %
Lymphs Abs: 1.4 10*3/uL (ref 0.7–4.0)
MCH: 22.2 pg — ABNORMAL LOW (ref 26.0–34.0)
MCHC: 30.9 g/dL (ref 30.0–36.0)
MCV: 71.7 fL — ABNORMAL LOW (ref 80.0–100.0)
Monocytes Absolute: 0.8 10*3/uL (ref 0.1–1.0)
Monocytes Relative: 6 %
Neutro Abs: 12.1 10*3/uL — ABNORMAL HIGH (ref 1.7–7.7)
Neutrophils Relative %: 84 %
Platelets: 270 10*3/uL (ref 150–400)
RBC: 4.06 MIL/uL — ABNORMAL LOW (ref 4.22–5.81)
RDW: 17.2 % — ABNORMAL HIGH (ref 11.5–15.5)
WBC: 14.5 10*3/uL — ABNORMAL HIGH (ref 4.0–10.5)
nRBC: 0 % (ref 0.0–0.2)

## 2021-04-23 LAB — CBC
HCT: 31.2 % — ABNORMAL LOW (ref 39.0–52.0)
Hemoglobin: 9.5 g/dL — ABNORMAL LOW (ref 13.0–17.0)
MCH: 22.1 pg — ABNORMAL LOW (ref 26.0–34.0)
MCHC: 30.4 g/dL (ref 30.0–36.0)
MCV: 72.6 fL — ABNORMAL LOW (ref 80.0–100.0)
Platelets: 296 10*3/uL (ref 150–400)
RBC: 4.3 MIL/uL (ref 4.22–5.81)
RDW: 17.3 % — ABNORMAL HIGH (ref 11.5–15.5)
WBC: 15.5 10*3/uL — ABNORMAL HIGH (ref 4.0–10.5)
nRBC: 0 % (ref 0.0–0.2)

## 2021-04-23 LAB — URINALYSIS, ROUTINE W REFLEX MICROSCOPIC
Bilirubin Urine: NEGATIVE
Glucose, UA: NEGATIVE mg/dL
Ketones, ur: NEGATIVE mg/dL
Nitrite: POSITIVE — AB
Protein, ur: >=300 mg/dL — AB
RBC / HPF: >50 RBC/hpf — ABNORMAL HIGH (ref 0–5)
Specific Gravity, Urine: 1.02 (ref 1.005–1.030)
WBC, UA: >50 WBC/hpf — ABNORMAL HIGH (ref 0–5)
pH: 5 (ref 5.0–8.0)

## 2021-04-23 LAB — RESP PANEL BY RT-PCR (FLU A&B, COVID) ARPGX2
Influenza A by PCR: NEGATIVE
Influenza B by PCR: NEGATIVE
SARS Coronavirus 2 by RT PCR: NEGATIVE

## 2021-04-23 MED ORDER — CHLORHEXIDINE GLUCONATE CLOTH 2 % EX PADS
6.0000 | MEDICATED_PAD | Freq: Every day | CUTANEOUS | Status: DC
  Administered 2021-04-23: 6 via TOPICAL

## 2021-04-23 MED ORDER — ACETAMINOPHEN 325 MG PO TABS
650.0000 mg | ORAL_TABLET | Freq: Four times a day (QID) | ORAL | Status: DC | PRN
  Administered 2021-04-23: 650 mg via ORAL
  Filled 2021-04-23 (×2): qty 2

## 2021-04-23 MED ORDER — ENOXAPARIN SODIUM 40 MG/0.4ML IJ SOSY
40.0000 mg | PREFILLED_SYRINGE | INTRAMUSCULAR | Status: DC
  Administered 2021-04-23: 40 mg via SUBCUTANEOUS
  Filled 2021-04-23: qty 0.4

## 2021-04-23 MED ORDER — SODIUM CHLORIDE 0.9 % IV SOLN
2.0000 g | INTRAVENOUS | Status: DC
  Administered 2021-04-23 – 2021-04-24 (×2): 2 g via INTRAVENOUS
  Filled 2021-04-23 (×2): qty 20

## 2021-04-23 MED ORDER — AMLODIPINE BESYLATE 10 MG PO TABS: 10.0000 mg | ORAL_TABLET | Freq: Every day | ORAL | Status: DC

## 2021-04-23 MED ORDER — ACETAMINOPHEN 650 MG RE SUPP: 650.0000 mg | Freq: Four times a day (QID) | RECTAL | Status: DC | PRN

## 2021-04-23 MED ORDER — OXYBUTYNIN CHLORIDE 5 MG PO TABS: 5.0000 mg | ORAL_TABLET | Freq: Three times a day (TID) | ORAL | Status: DC | PRN

## 2021-04-23 MED ORDER — HYDRALAZINE HCL 20 MG/ML IJ SOLN: 10.0000 mg | INTRAMUSCULAR | Status: DC | PRN

## 2021-04-23 MED ORDER — LACTATED RINGERS IV SOLN: INTRAVENOUS | Status: AC

## 2021-04-23 NOTE — ED Notes (Signed)
Pt. Wife called to give an update on pt. Status. Pt.'s wife was unable to be contacted at [phone]

## 2021-04-23 NOTE — ED Triage Notes (Signed)
Pt had surgery on prostate on Wednesday and today started to have chills and fever. Urologist advised he be brought immediately to ER. Pt denies any pain

## 2021-04-23 NOTE — Discharge Summary (Addendum)
Date of admission: 04/18/2021  Date of discharge: 04/19/2021  Admission diagnosis: 1.  BPH with Urinary retention 2.  Grade 1 prostate cancer   Discharge diagnosis: Same   Procedures:  TURP, TRUSP with prostate biopsy  History and Physical: For full details, please see admission history and physical. Briefly, Patrick Everett is a 66 y.o. year old patient with a history of grade 1 prostate cancer and BPH with prolonged urinary retention requiring an indwelling Foley catheter  Hospital Course: Post-op, the patient was monitored on the floor.  On POD1, his urine was clear with minimal CBI.  He was discharged home with his catheter  Physical Exam:  General: Alert and oriented CV: RRR, palpable distal pulses Lungs: CTAB, equal chest rise Abdomen: Soft, NTND, no rebound or guarding GU:  3-way Foley in place and draining light pink urine with minimal CBI Ext: NT, No erythema  Laboratory values:  Recent Labs    04/22/21 2316 04/23/21 0450  HGB 9.5* 9.0*  HCT 31.2* 29.1*   Recent Labs    04/22/21 2316 04/23/21 0450  CREATININE 2.14* 1.52*    Disposition: Home  Discharge instruction: The patient was instructed to be ambulatory but told to refrain from heavy lifting, strenuous activity, or driving.  Discharge medications:  Allergies as of 04/19/2021   No Known Allergies      Medication List     TAKE these medications    amLODipine 10 MG tablet Commonly known as: NORVASC Take 10 mg by mouth daily.   oxybutynin 5 MG tablet Commonly known as: DITROPAN Take 1 tablet (5 mg total) by mouth every 8 (eight) hours as needed for bladder spasms.   phenazopyridine 200 MG tablet Commonly known as: Pyridium Take 1 tablet (200 mg total) by mouth 3 (three) times daily as needed (for pain with urination).   traMADol 50 MG tablet Commonly known as: Ultram Take 1 tablet (50 mg total) by mouth every 6 (six) hours as needed for up to 3 days.   Vitamin D (Ergocalciferol) 1.25 MG (50000  UNIT) Caps capsule Commonly known as: DRISDOL Take 50,000 Units by mouth every 7 (seven) days. Saturday        Followup:   Follow-up Information     [facility] Follow up on 04/24/2021.   Why: Catheter removal at 10:30 AM Contact information: [place] [phone]

## 2021-04-23 NOTE — Plan of Care (Signed)

## 2021-04-23 NOTE — Progress Notes (Signed)
Pt offered scheduled Lovenox injection -- pt refused stating that he only wants the SCDs applied. Pt was educated on importance of Lovenox injection and stated that he understood the use of it. Pt still refused Lovenox inj and SCDs applied per pt request.

## 2021-04-23 NOTE — H&P (Addendum)
History and Physical    Patrick Everett TTS:[id number] DOB: 03/17/1956 DOA: 04/22/2021  PCP: Kristie Cowman, MD  Patient coming from: Home.  Chief Complaint: Fever chills.  HPI: Patrick Everett is a 66 y.o. male with history of prostate cancer, BPH, hypertension who had a prostate biopsy on April 18, 2021 by Dr. Lovena Neighbours urologist started experiencing fever chills over the last 24 hours.  Denies any productive cough chest pain nausea vomiting or diarrhea.  Has indwelling Foley catheter.  Has been making urine.  ED Course: In the ER patient had low blood pressure with tachycardia temperature 100.8 F with labs showing leukocytosis acute renal failure UA consistent with UTI.  Patient had cultures drawn and started on empiric antibiotics admitted for possible developing sepsis.  ER physician did discuss with on-call urologist.  COVID test is negative.  Review of Systems: As per HPI, rest all negative.   Past Medical History:  Diagnosis Date   Hypertension    Prostate cancer ([place]) 2021   Stage T1C adenocarcinoma    Past Surgical History:  Procedure Laterality Date   PROSTATE BIOPSY  11/02/2019   transrectal [place] w/ 12 biopsies   PROSTATE BIOPSY N/A 04/18/2021   Procedure: BIOPSY TRANSRECTAL ULTRASONIC PROSTATE (TUBP);  Surgeon: Ceasar Mons, MD;  Location: [facility];  Service: Urology;  Laterality: N/A;   PROSTATE BIOPSY N/A 04/18/2021   Procedure: PROSTATE BIOPSY;  Surgeon: Ceasar Mons, MD;  Location: [facility];  Service: Urology;  Laterality: N/A;   TRANSURETHRAL RESECTION OF PROSTATE N/A 04/18/2021   Procedure: TRANSURETHRAL RESECTION OF THE PROSTATE (TURP), BIPOLAR;  Surgeon: Ceasar Mons, MD;  Location: [facility];  Service: Urology;  Laterality: N/A;   WISDOM TOOTH EXTRACTION     as a teenager   XI ROBOTIC ASSISTED INGUINAL HERNIA REPAIR WITH MESH  2014     reports that he has never smoked. He has  never used smokeless tobacco. He reports current alcohol use. He reports that he does not use drugs.  No Known Allergies  Family History  Problem Relation Age of Onset   CAD Mother    Breast cancer Neg Hx    Colon cancer Neg Hx    Pancreatic cancer Neg Hx    Prostate cancer Neg Hx     Prior to Admission medications   Medication Sig Start Date End Date Taking? Authorizing Provider  amLODipine (NORVASC) 10 MG tablet Take 10 mg by mouth daily. 06/07/20  Yes [provider]  ciprofloxacin (CIPRO) 500 MG tablet Take 500 mg by mouth See admin instructions. Take 500mg  by twice a day for 3 days before surgery, then 500mg  twice a day 3 days after surgery 03/01/21  Yes [provider]  oxybutynin (DITROPAN) 5 MG tablet Take 1 tablet (5 mg total) by mouth every 8 (eight) hours as needed for bladder spasms. 04/19/21  Yes Ceasar Mons, MD  Vitamin D, Ergocalciferol, (DRISDOL) 1.25 MG (50000 UNIT) CAPS capsule Take 50,000 Units by mouth every 7 (seven) days. Saturday   Yes [provider]  phenazopyridine (PYRIDIUM) 200 MG tablet Take 1 tablet (200 mg total) by mouth 3 (three) times daily as needed (for pain with urination). Patient not taking: Reported on 04/23/2021 04/19/21 04/19/22  Ceasar Mons, MD  traMADol (ULTRAM) 50 MG tablet Take 1 tablet (50 mg total) by mouth every 6 (six) hours as needed for up to 3 days. Patient not taking: Reported on 04/23/2021 04/19/21 04/23/22  Ceasar Mons, MD    Physical Exam: Constitutional: Moderately built and nourished. Vitals:   04/23/21 0200 04/23/21 0230 04/23/21 0300 04/23/21 0328  BP: 106/74 114/71 114/74 106/63  Pulse: 88 89 85 88  Resp: (!) 23 (!) 23 (!) 21 18  Temp:   (!) 100.8 F (38.2 C) 99 F (37.2 C)  TempSrc:    Oral  SpO2: 97% 98% 97% 97%  Weight:      Height:       Eyes: Anicteric no pallor. ENMT: No discharge from the ears eyes nose and mouth. Neck: No mass felt.  No neck  rigidity. Respiratory: No rhonchi or crepitations. Cardiovascular: S1-S2 heard. Abdomen: Soft nontender bowel sound present. Musculoskeletal: No edema. Skin: No rash. Neurologic: Alert awake oriented time place and person.  Moves all extremities. Psychiatric: Appears normal.  Normal affect.   Labs on Admission: I have personally reviewed following labs and imaging studies  CBC: Recent Labs  Lab 04/18/21 0814 04/18/21 1210 04/19/21 0219 04/22/21 2316  WBC  --   --   --  15.5*  HGB 15.0 11.0* 9.7* 9.5*  HCT 44.0 36.6* 31.7* 31.2*  MCV  --   --   --  72.6*  PLT  --   --   --  563   Basic Metabolic Panel: Recent Labs  Lab 04/18/21 0814 04/18/21 1210 04/19/21 0219 04/22/21 2316  NA 137 137 134* 133*  K 5.9* 3.6 4.2 3.9  CL 106 107 103 101  CO2  --  24 22 24   GLUCOSE 93 174* 149* 144*  BUN 16 12 16  24*  CREATININE 1.20 1.08 1.12 2.14*  CALCIUM  --  8.0* 8.6* 8.5*   GFR: Estimated Creatinine Clearance: 34 mL/min (A) (by C-G formula based on SCr of 2.14 mg/dL (H)). Liver Function Tests: No results for input(s): AST, ALT, ALKPHOS, BILITOT, PROT, ALBUMIN in the last 168 hours. No results for input(s): LIPASE, AMYLASE in the last 168 hours. No results for input(s): AMMONIA in the last 168 hours. Coagulation Profile: No results for input(s): INR, PROTIME in the last 168 hours. Cardiac Enzymes: No results for input(s): CKTOTAL, CKMB, CKMBINDEX, TROPONINI in the last 168 hours. BNP (last 3 results) No results for input(s): PROBNP in the last 8760 hours. HbA1C: No results for input(s): HGBA1C in the last 72 hours. CBG: No results for input(s): GLUCAP in the last 168 hours. Lipid Profile: No results for input(s): CHOL, HDL, LDLCALC, TRIG, CHOLHDL, LDLDIRECT in the last 72 hours. Thyroid Function Tests: No results for input(s): TSH, T4TOTAL, FREET4, T3FREE, THYROIDAB in the last 72 hours. Anemia Panel: No results for input(s): VITAMINB12, FOLATE, FERRITIN, TIBC, IRON,  RETICCTPCT in the last 72 hours. Urine analysis:    Component Value Date/Time   COLORURINE YELLOW 04/22/2021 2316   APPEARANCEUR HAZY (A) 04/22/2021 2316   LABSPEC 1.020 04/22/2021 2316   PHURINE 5.0 04/22/2021 2316   GLUCOSEU NEGATIVE 04/22/2021 2316   HGBUR LARGE (A) 04/22/2021 2316   BILIRUBINUR NEGATIVE 04/22/2021 2316   KETONESUR NEGATIVE 04/22/2021 2316   PROTEINUR >=300 (A) 04/22/2021 2316   NITRITE POSITIVE (A) 04/22/2021 2316   LEUKOCYTESUR LARGE (A) 04/22/2021 2316   Sepsis Labs: @LABRCNTIP (procalcitonin:4,lacticidven:4) ) Recent Results (from the past 240 hour(s))  Resp Panel by RT-PCR (Flu A&B, Covid) Nasopharyngeal Swab     Status: None   Collection Time: 04/22/21 11:16 PM   Specimen: Nasopharyngeal Swab; Nasopharyngeal(NP) swabs in vial transport medium  Result Value Ref Range Status   SARS  Coronavirus 2 by RT PCR NEGATIVE NEGATIVE Final    Comment: (NOTE) SARS-CoV-2 target nucleic acids are NOT DETECTED.  The SARS-CoV-2 RNA is generally detectable in upper respiratory specimens during the acute phase of infection. The lowest concentration of SARS-CoV-2 viral copies this assay can detect is 138 copies/mL. A negative result does not preclude SARS-Cov-2 infection and should not be used as the sole basis for treatment or other patient management decisions. A negative result may occur with  improper specimen collection/handling, submission of specimen other than nasopharyngeal swab, presence of viral mutation(s) within the areas targeted by this assay, and inadequate number of viral copies(<138 copies/mL). A negative result must be combined with clinical observations, patient history, and epidemiological information. The expected result is Negative.  Fact Sheet for Patients:  EntrepreneurPulse.com.au  Fact Sheet for Healthcare Providers:  IncredibleEmployment.be  This test is no t yet approved or cleared by the [place]  FDA and  has been authorized for detection and/or diagnosis of SARS-CoV-2 by FDA under an Emergency Use Authorization (EUA). This EUA will remain  in effect (meaning this test can be used) for the duration of the COVID-19 declaration under Section 564(b)(1) of the Act, 21 U.S.C.section 360bbb-3(b)(1), unless the authorization is terminated  or revoked sooner.       Influenza A by PCR NEGATIVE NEGATIVE Final   Influenza B by PCR NEGATIVE NEGATIVE Final    Comment: (NOTE) The Xpert Xpress SARS-CoV-2/FLU/RSV plus assay is intended as an aid in the diagnosis of influenza from Nasopharyngeal swab specimens and should not be used as a sole basis for treatment. Nasal washings and aspirates are unacceptable for Xpert Xpress SARS-CoV-2/FLU/RSV testing.  Fact Sheet for Patients: EntrepreneurPulse.com.au  Fact Sheet for Healthcare Providers: IncredibleEmployment.be  This test is not yet approved or cleared by the [place] FDA and has been authorized for detection and/or diagnosis of SARS-CoV-2 by FDA under an Emergency Use Authorization (EUA). This EUA will remain in effect (meaning this test can be used) for the duration of the COVID-19 declaration under Section 564(b)(1) of the Act, 21 U.S.C. section 360bbb-3(b)(1), unless the authorization is terminated or revoked.  Performed at [facility], [place] [address], [place], [place] [address]      Radiological Exams on Admission: No results found.    Assessment/Plan Principal Problem:   Sepsis ([place]) Active Problems:   Malignant neoplasm of prostate ([place])   ARF (acute renal failure) ([place])   Acute lower UTI    Possible developing sepsis likely source could be urinary tract infection with recent prostate biopsy -we will keep patient on empiric antibiotics follow cultures continue hydration.  Urology has been notified.  At presentation patient was hypotensive tachycardic  febrile with leukocytosis consistent with sepsis physiology. Acute renal failure cause not clear.  Patient does have indwelling Foley catheter and has been draining.  Patient denies taking any NSAIDs.  May be from low blood pressure.  And also has worsening anemia.  Closely monitor intake output metabolic panel. Hypertension takes amlodipine which we will hold for now since patient was hypotensive at presentation.  As needed IV hydralazine has been ordered. Chronic anemia follow CBC.  Hemoglobin is around baseline.  But 1 reading was elevated on April 18, 2021. History of prostate cancer and BPH recent prostate biopsy and at this time patient does have indwelling Foley catheter being followed by Dr. Lovena Neighbours urologist.    DVT prophylaxis: Lovenox. Code Status: Full code. Family Communication: Discussed with patient. Disposition Plan: Home.  Consults called: ER physician discussed urologist. Admission status: Observation.   Rise Patience MD [facility] Pager [phone].  If 7PM-7AM, please contact night-coverage [email or web address] Password [facility]  04/23/2021, 4:06 AM

## 2021-04-23 NOTE — Progress Notes (Signed)
PROGRESS NOTE  Patrick Everett ZOX:[id number] DOB: 1955-04-09 DOA: 04/22/2021 PCP: Kristie Cowman, MD   LOS: 0 days   Brief narrative:  Patrick Everett is a 66 y.o. male with past medical history of prostate cancer, BPH hypertension [facility] with fever and chills for 24 hours after prostate biopsy on April 18, 2021 by Dr. Lovena Neighbours.  Patient did have a Foley catheter.  In the ED, patient had low blood pressure with tachycardia and febrile to 100.8 Fahrenheit with leukocytosis and acute kidney injury.  Patient was started on broad-spectrum antibiotic and then was admitted hospital for further evaluation and treatment.    Assessment/Plan:  Principal Problem:   Sepsis ([place]) Active Problems:   Malignant neoplasm of prostate ([place])   ARF (acute renal failure) ([place])   Acute lower UTI  Sepsis likely secondary to recent prostate biopsy.  Urology on board.  Continue with IV antibiotic.      Acute kidney injury.  Creatinine elevated 1.5.   Continue Foley catheter.  We will continue to monitor.  Essential hypertension  On hydralazine as needed.  Takes amlodipine at home.  Continue to monitor.  Chronic anemia   Hemoglobin of 9.0.  Likely at baseline.  Continue to monitor  History of prostate cancer and BPH recent prostate biopsy. Indwelling Foley catheter being followed by Dr. Lovena Neighbours urologist.    DVT prophylaxis: enoxaparin (LOVENOX) injection 40 mg Start: 04/23/21 1000   Code Status: Full code  Family Communication:   spoke with the patient at bedside.  Status is: Observation  The patient will require care spanning > 2 midnights and should be moved to inpatient because: Sepsis secondary to UTI, IV antibiotic   Consultants: Urology  Procedures: Foley cath  Anti-infectives:  Rocephin IV  Anti-infectives (From admission, onward)    Start     Dose/Rate Route Frequency Ordered Stop   04/23/21 2300  cefTRIAXone (ROCEPHIN) 2 g in sodium chloride 0.9 % 100 mL IVPB        2  g 200 mL/hr over 30 Minutes Intravenous Every 24 hours 04/23/21 0405     04/22/21 2330  cefTRIAXone (ROCEPHIN) 2 g in sodium chloride 0.9 % 100 mL IVPB        2 g 200 mL/hr over 30 Minutes Intravenous  Once 04/22/21 2315 04/23/21 0017       Subjective Today, patient was seen and examined at bedside.  At the time of my evaluation, patient states that he feels a little better.  Denies any fever chills nausea vomiting.   Objective: Vitals:   04/23/21 0300 04/23/21 0328  BP: 114/74 106/63  Pulse: 85 88  Resp: (!) 21 18  Temp: (!) 100.8 F (38.2 C) 99 F (37.2 C)  SpO2: 97% 97%    Intake/Output Summary (Last 24 hours) at 04/23/2021 0711 Last data filed at 04/23/2021 0500 Gross per 24 hour  Intake --  Output 250 ml  Net -250 ml   Filed Weights   04/22/21 2237  Weight: 77.1 kg   Body mass index is 25.1 kg/m.   Physical Exam: GENERAL: Patient is alert awake and oriented. Not in obvious distress. HENT: No scleral pallor or icterus. Pupils equally reactive to light. Oral mucosa is moist NECK: is supple, no gross swelling noted. CHEST: Clear to auscultation. No crackles or wheezes.  Diminished breath sounds bilaterally. CVS: S1 and S2 heard, no murmur. Regular rate and rhythm.  ABDOMEN: Soft, non-tender, bowel sounds are present.  Indwelling Foley catheter in place with clear  urine. EXTREMITIES: No edema. CNS: Cranial nerves are intact. No focal motor deficits. SKIN: warm and dry without rashes.  Data Review: I have personally reviewed the following laboratory data and studies,  CBC: Recent Labs  Lab 04/18/21 0814 04/18/21 1210 04/19/21 0219 04/22/21 2316 04/23/21 0450  WBC  --   --   --  15.5* 14.5*  NEUTROABS  --   --   --   --  12.1*  HGB 15.0 11.0* 9.7* 9.5* 9.0*  HCT 44.0 36.6* 31.7* 31.2* 29.1*  MCV  --   --   --  72.6* 71.7*  PLT  --   --   --  296 349   Basic Metabolic Panel: Recent Labs  Lab 04/18/21 0814 04/18/21 1210 04/19/21 0219 04/22/21 2316  04/23/21 0450  NA 137 137 134* 133* 132*  K 5.9* 3.6 4.2 3.9 3.5  CL 106 107 103 101 102  CO2  --  24 22 24 24   GLUCOSE 93 174* 149* 144* 107*  BUN 16 12 16  24* 23  CREATININE 1.20 1.08 1.12 2.14* 1.52*  CALCIUM  --  8.0* 8.6* 8.5* 8.0*   Liver Function Tests: Recent Labs  Lab 04/23/21 0450  AST 17  ALT 14  ALKPHOS 57  BILITOT 0.9  PROT 5.8*  ALBUMIN 3.0*   No results for input(s): LIPASE, AMYLASE in the last 168 hours. No results for input(s): AMMONIA in the last 168 hours. Cardiac Enzymes: No results for input(s): CKTOTAL, CKMB, CKMBINDEX, TROPONINI in the last 168 hours. BNP (last 3 results) No results for input(s): BNP in the last 8760 hours.  ProBNP (last 3 results) No results for input(s): PROBNP in the last 8760 hours.  CBG: No results for input(s): GLUCAP in the last 168 hours. Recent Results (from the past 240 hour(s))  Resp Panel by RT-PCR (Flu A&B, Covid) Nasopharyngeal Swab     Status: None   Collection Time: 04/22/21 11:16 PM   Specimen: Nasopharyngeal Swab; Nasopharyngeal(NP) swabs in vial transport medium  Result Value Ref Range Status   SARS Coronavirus 2 by RT PCR NEGATIVE NEGATIVE Final    Comment: (NOTE) SARS-CoV-2 target nucleic acids are NOT DETECTED.  The SARS-CoV-2 RNA is generally detectable in upper respiratory specimens during the acute phase of infection. The lowest concentration of SARS-CoV-2 viral copies this assay can detect is 138 copies/mL. A negative result does not preclude SARS-Cov-2 infection and should not be used as the sole basis for treatment or other patient management decisions. A negative result may occur with  improper specimen collection/handling, submission of specimen other than nasopharyngeal swab, presence of viral mutation(s) within the areas targeted by this assay, and inadequate number of viral copies(<138 copies/mL). A negative result must be combined with clinical observations, patient history, and  epidemiological information. The expected result is Negative.  Fact Sheet for Patients:  EntrepreneurPulse.com.au  Fact Sheet for Healthcare Providers:  IncredibleEmployment.be  This test is no t yet approved or cleared by the [place] FDA and  has been authorized for detection and/or diagnosis of SARS-CoV-2 by FDA under an Emergency Use Authorization (EUA). This EUA will remain  in effect (meaning this test can be used) for the duration of the COVID-19 declaration under Section 564(b)(1) of the Act, 21 U.S.C.section 360bbb-3(b)(1), unless the authorization is terminated  or revoked sooner.       Influenza A by PCR NEGATIVE NEGATIVE Final   Influenza B by PCR NEGATIVE NEGATIVE Final    Comment: (NOTE) The Xpert  Xpress SARS-CoV-2/FLU/RSV plus assay is intended as an aid in the diagnosis of influenza from Nasopharyngeal swab specimens and should not be used as a sole basis for treatment. Nasal washings and aspirates are unacceptable for Xpert Xpress SARS-CoV-2/FLU/RSV testing.  Fact Sheet for Patients: EntrepreneurPulse.com.au  Fact Sheet for Healthcare Providers: IncredibleEmployment.be  This test is not yet approved or cleared by the [place] FDA and has been authorized for detection and/or diagnosis of SARS-CoV-2 by FDA under an Emergency Use Authorization (EUA). This EUA will remain in effect (meaning this test can be used) for the duration of the COVID-19 declaration under Section 564(b)(1) of the Act, 21 U.S.C. section 360bbb-3(b)(1), unless the authorization is terminated or revoked.  Performed at [facility], [place] [address], [place], [place] [address]      Studies: No results found.    Flora Lipps, MD  [facility] 04/23/2021  If 7PM-7AM, please contact night-coverage

## 2021-04-23 NOTE — TOC Initial Note (Signed)
Transition of Care [facility]) - Initial/Assessment Note    Patient Details  Name: Patrick Everett MRN: [id number] Date of Birth: 29-Aug-1955  Transition of Care [facility]) CM/SW Contact:    Leeroy Cha, RN Phone Number: 04/23/2021, 8:54 AM  Clinical Narrative:                  Transition of Care [facility]) Screening Note   Patient Details  Name: Patrick Everett Date of Birth: [birth year]   Transition of Care [facility]) CM/SW Contact:    Leeroy Cha, RN Phone Number: 04/23/2021, 8:54 AM    [facility]) has reviewed patient and no TOC needs have been identified at this time. We will continue to monitor patient advancement through interdisciplinary progression rounds. If new patient transition needs arise, please place a TOC consult.    Expected Discharge Plan: Home/Self Care Barriers to Discharge: Continued Medical Work up   Patient Goals and CMS Choice Patient states their goals for this hospitalization and ongoing recovery are:: to go home CMS Medicare.gov Compare Post Acute Care list provided to:: Patient    Expected Discharge Plan and Services Expected Discharge Plan: Home/Self Care   Discharge Planning Services: CM Consult   Living arrangements for the past 2 months: Single Family Home                                      Prior Living Arrangements/Services Living arrangements for the past 2 months: Single Family Home Lives with:: Spouse Patient language and need for interpreter reviewed:: Yes Do you feel safe going back to the place where you live?: Yes            Criminal Activity/Legal Involvement Pertinent to Current Situation/Hospitalization: No - Comment as needed  Activities of Daily Living      Permission Sought/Granted                  Emotional Assessment   Attitude/Demeanor/Rapport: Engaged Affect (typically observed): Calm Orientation: : Oriented to Place, Oriented to Self, Oriented to  Time, Oriented to  Situation Alcohol / Substance Use: Not Applicable Psych Involvement: No (comment)  Admission diagnosis:  ARF (acute renal failure) ([place]) [N17.9] Sepsis ([place]) [A41.9] Patient Active Problem List   Diagnosis Date Noted   ARF (acute renal failure) ([place]) 04/23/2021   Sepsis ([place]) 04/23/2021   Acute lower UTI 04/23/2021   BPH with urinary obstruction 04/18/2021   Catheter-associated urinary tract infection ([place]) 09/14/2020   AKI (acute kidney injury) ([place]) 08/28/2020   Bacteriuria 08/28/2020   Dehydration 08/28/2020   Hypokalemia 08/28/2020   Acute urinary retention 08/28/2020   Nausea and vomiting 08/28/2020   Nausea & vomiting 08/27/2020   Malignant neoplasm of prostate ([place]) 12/07/2019   PCP:  Kristie Cowman, MD Pharmacy:   [id number] - [place], [place] [address] Phone: [phone] Fax: [phone]     Social Determinants of Health (SDOH) Interventions    Readmission Risk Interventions No flowsheet data found.

## 2021-04-24 DIAGNOSIS — N179 Acute kidney failure, unspecified: Secondary | ICD-10-CM | POA: Diagnosis present

## 2021-04-24 DIAGNOSIS — N400 Enlarged prostate without lower urinary tract symptoms: Secondary | ICD-10-CM | POA: Diagnosis present

## 2021-04-24 DIAGNOSIS — A419 Sepsis, unspecified organism: Secondary | ICD-10-CM | POA: Diagnosis present

## 2021-04-24 DIAGNOSIS — I10 Essential (primary) hypertension: Secondary | ICD-10-CM | POA: Diagnosis present

## 2021-04-24 DIAGNOSIS — C61 Malignant neoplasm of prostate: Secondary | ICD-10-CM | POA: Diagnosis not present

## 2021-04-24 DIAGNOSIS — Z79899 Other long term (current) drug therapy: Secondary | ICD-10-CM | POA: Diagnosis not present

## 2021-04-24 DIAGNOSIS — Z8546 Personal history of malignant neoplasm of prostate: Secondary | ICD-10-CM | POA: Diagnosis not present

## 2021-04-24 DIAGNOSIS — N390 Urinary tract infection, site not specified: Secondary | ICD-10-CM | POA: Diagnosis present

## 2021-04-24 DIAGNOSIS — D649 Anemia, unspecified: Secondary | ICD-10-CM | POA: Diagnosis present

## 2021-04-24 DIAGNOSIS — Z8249 Family history of ischemic heart disease and other diseases of the circulatory system: Secondary | ICD-10-CM | POA: Diagnosis not present

## 2021-04-24 DIAGNOSIS — B9620 Unspecified Escherichia coli [E. coli] as the cause of diseases classified elsewhere: Secondary | ICD-10-CM | POA: Diagnosis present

## 2021-04-24 DIAGNOSIS — Z20822 Contact with and (suspected) exposure to covid-19: Secondary | ICD-10-CM | POA: Diagnosis present

## 2021-04-24 LAB — CBC
HCT: 30.1 % — ABNORMAL LOW (ref 39.0–52.0)
Hemoglobin: 9.2 g/dL — ABNORMAL LOW (ref 13.0–17.0)
MCH: 22 pg — ABNORMAL LOW (ref 26.0–34.0)
MCHC: 30.6 g/dL (ref 30.0–36.0)
MCV: 71.8 fL — ABNORMAL LOW (ref 80.0–100.0)
Platelets: 272 10*3/uL (ref 150–400)
RBC: 4.19 MIL/uL — ABNORMAL LOW (ref 4.22–5.81)
RDW: 17.2 % — ABNORMAL HIGH (ref 11.5–15.5)
WBC: 8.5 10*3/uL (ref 4.0–10.5)
nRBC: 0 % (ref 0.0–0.2)

## 2021-04-24 LAB — BASIC METABOLIC PANEL
Anion gap: 4 — ABNORMAL LOW (ref 5–15)
BUN: 12 mg/dL (ref 8–23)
CO2: 24 mmol/L (ref 22–32)
Calcium: 8 mg/dL — ABNORMAL LOW (ref 8.9–10.3)
Chloride: 105 mmol/L (ref 98–111)
Creatinine, Ser: 1.1 mg/dL (ref 0.61–1.24)
GFR, Estimated: >60 mL/min (ref >60)
Glucose, Bld: 118 mg/dL — ABNORMAL HIGH (ref 70–99)
Potassium: 3.7 mmol/L (ref 3.5–5.1)
Sodium: 133 mmol/L — ABNORMAL LOW (ref 135–145)

## 2021-04-24 LAB — MAGNESIUM: Magnesium: 2 mg/dL (ref 1.7–2.4)

## 2021-04-24 MED ORDER — DOCUSATE SODIUM 100 MG PO CAPS
100.0000 mg | ORAL_CAPSULE | Freq: Every day | ORAL | Status: DC
  Administered 2021-04-24 – 2021-04-25 (×2): 100 mg via ORAL
  Filled 2021-04-24 (×2): qty 1

## 2021-04-24 MED ORDER — AMLODIPINE BESYLATE 10 MG PO TABS
10.0000 mg | ORAL_TABLET | Freq: Every day | ORAL | Status: DC
  Administered 2021-04-24 – 2021-04-25 (×2): 10 mg via ORAL
  Filled 2021-04-24 (×2): qty 1

## 2021-04-24 MED ORDER — POLYETHYLENE GLYCOL 3350 17 G PO PACK
17.0000 g | PACK | Freq: Every day | ORAL | Status: DC
  Administered 2021-04-24: 12:00:00 17 g via ORAL
  Filled 2021-04-24: qty 1

## 2021-04-24 NOTE — Progress Notes (Addendum)
Subjective: Patient reports feeling well.  He has only ambulated in the room.  Denies N/V.  Has not had a BM since Sunday. No pain. On day 3 IV Rocephin.  AF with WBC and Cr trending down.  Blood cultures NGTD Urine culture 04/22/21 10,000 colonies E. Coli with susceptibilities pending.  Per pt report urine was obtained from bag.   Urine culture 09/13/20 P. Aeruginosa and K. Pneumoniae sens to Cipro Pre op urine culture from office 04/12/21 mixed growth more than 3 organisms >100k colonies  Pt was on PO Cipro for 3 days prior to procedure 04/18/21 and received IV Cipro intra-op.  Objective: Vital signs in last 24 hours: Temp:  [98.1 F (36.7 C)-101.1 F (38.4 C)] 99.5 F (37.5 C) (01/24 0607) Pulse Rate:  [78-98] 78 (01/24 0607) Resp:  [16-18] 18 (01/24 0607) BP: (116-137)/(77-88) 121/80 (01/24 0607) SpO2:  [98 %-100 %] 100 % (01/24 0607)  Intake/Output from previous day: 01/23 0701 - 01/24 0700 In: 3971.8 [P.O.:960; I.V.:2911.7; IV Piggyback:100.1] Out: 4950 [Urine:4950] Intake/Output this shift: No intake/output data recorded.  Physical Exam:  General:alert, cooperative, and no distress GI: soft, non tender, normal bowel sounds, no palpable masses Foley in place with clear/yellow urine  Lab Results: Recent Labs    04/22/21 2316 04/23/21 0450 04/24/21 0400  HGB 9.5* 9.0* 9.2*  HCT 31.2* 29.1* 30.1*   BMET Recent Labs    04/23/21 0450 04/24/21 0400  NA 132* 133*  K 3.5 3.7  CL 102 105  CO2 24 24  GLUCOSE 107* 118*  BUN 23 12  CREATININE 1.52* 1.10  CALCIUM 8.0* 8.0*   No results for input(s): LABPT, INR in the last 72 hours. No results for input(s): LABURIN in the last 72 hours. Results for orders placed or performed during the hospital encounter of 04/22/21  Resp Panel by RT-PCR (Flu A&B, Covid) Nasopharyngeal Swab     Status: None   Collection Time: 04/22/21 11:16 PM   Specimen: Nasopharyngeal Swab; Nasopharyngeal(NP) swabs in vial transport medium   Result Value Ref Range Status   SARS Coronavirus 2 by RT PCR NEGATIVE NEGATIVE Final    Comment: (NOTE) SARS-CoV-2 target nucleic acids are NOT DETECTED.  The SARS-CoV-2 RNA is generally detectable in upper respiratory specimens during the acute phase of infection. The lowest concentration of SARS-CoV-2 viral copies this assay can detect is 138 copies/mL. A negative result does not preclude SARS-Cov-2 infection and should not be used as the sole basis for treatment or other patient management decisions. A negative result may occur with  improper specimen collection/handling, submission of specimen other than nasopharyngeal swab, presence of viral mutation(s) within the areas targeted by this assay, and inadequate number of viral copies(<138 copies/mL). A negative result must be combined with clinical observations, patient history, and epidemiological information. The expected result is Negative.  Fact Sheet for Patients:  EntrepreneurPulse.com.au  Fact Sheet for Healthcare Providers:  IncredibleEmployment.be  This test is no t yet approved or cleared by the [place] FDA and  has been authorized for detection and/or diagnosis of SARS-CoV-2 by FDA under an Emergency Use Authorization (EUA). This EUA will remain  in effect (meaning this test can be used) for the duration of the COVID-19 declaration under Section 564(b)(1) of the Act, 21 U.S.C.section 360bbb-3(b)(1), unless the authorization is terminated  or revoked sooner.       Influenza A by PCR NEGATIVE NEGATIVE Final   Influenza B by PCR NEGATIVE NEGATIVE Final    Comment: (NOTE) The  Xpert Xpress SARS-CoV-2/FLU/RSV plus assay is intended as an aid in the diagnosis of influenza from Nasopharyngeal swab specimens and should not be used as a sole basis for treatment. Nasal washings and aspirates are unacceptable for Xpert Xpress SARS-CoV-2/FLU/RSV testing.  Fact Sheet for  Patients: EntrepreneurPulse.com.au  Fact Sheet for Healthcare Providers: IncredibleEmployment.be  This test is not yet approved or cleared by the [place] FDA and has been authorized for detection and/or diagnosis of SARS-CoV-2 by FDA under an Emergency Use Authorization (EUA). This EUA will remain in effect (meaning this test can be used) for the duration of the COVID-19 declaration under Section 564(b)(1) of the Act, 21 U.S.C. section 360bbb-3(b)(1), unless the authorization is terminated or revoked.  Performed at [facility], [place] [address], [place], [place] [address]   Urine Culture     Status: Abnormal (Preliminary result)   Collection Time: 04/22/21 11:16 PM   Specimen: Urine, Clean Catch  Result Value Ref Range Status   Specimen Description   Final    URINE, CLEAN CATCH Performed at [facility], [place] [address] [place], [place] [address]    Special Requests   Final    NONE Performed at [facility], [place] [address] [place], [place] [address]    Culture (A)  Final    10,000 COLONIES/mL ESCHERICHIA COLI SUSCEPTIBILITIES TO FOLLOW Performed at [facility], [place] [address] [place], [place] [address]    Report Status PENDING  Incomplete  Culture, blood (routine x 2)     Status: None (Preliminary result)   Collection Time: 04/23/21  2:25 AM   Specimen: BLOOD  Result Value Ref Range Status   Specimen Description   Final    BLOOD RIGHT ANTECUBITAL Performed at [place] [address] [place], [place] [address]    Special Requests   Final    BOTTLES DRAWN AEROBIC AND ANAEROBIC Blood Culture adequate volume Performed at [place] [address] [place], [place] [address]    Culture   Final    NO GROWTH 1 DAY Performed at [facility], [place] [address] [place], [place] [address]    Report Status PENDING   Incomplete  Culture, blood (routine x 2)     Status: None (Preliminary result)   Collection Time: 04/23/21  2:35 AM   Specimen: BLOOD  Result Value Ref Range Status   Specimen Description   Final    BLOOD BLOOD LEFT FOREARM Performed at [place] [address], [place], [place] [address]    Special Requests   Final    BOTTLES DRAWN AEROBIC AND ANAEROBIC Blood Culture adequate volume Performed at [place] [address], [place],  [address]    Culture   Final    NO GROWTH 1 DAY Performed at [facility], [place] [address], [place],  [address]    Report Status PENDING  Incomplete    Studies/Results: No results found.  Assessment/Plan:  Sepsis due to UTI following TRUS/prostate bx/TURP 04/18/21 Improving  Continue IV ABx until culture results available then switch to PO to complete 14 days total Remove foley with TOV today.  If unable to void after 6 hours or PVR >335ml replace foley  Ambulate in halls  IS Miralax now for constipation and dulcolax supp prn.  Start stool softner.  Avoid oxybutynin as it contributes to constipation.    LOS: 0 days   Estill Bamberg  Jennifermarie Franzen 04/24/2021, 10:49 AM

## 2021-04-24 NOTE — Progress Notes (Signed)
I spoke with the patient and his wife over the phone.  Will plan for a voiding trial today.  If he is unable to void after 6 hours or if his PVR is >300 mL, orders have been placed to replace an 18 F 2-way catheter.

## 2021-04-24 NOTE — Progress Notes (Signed)
PVR at 12noon was 30ml...the patient has had successful urination after removal of catheter.

## 2021-04-24 NOTE — Progress Notes (Signed)
PROGRESS NOTE  Thelonious Kauffmann GEX:[id number] DOB: 1955-07-07 DOA: 04/22/2021 PCP: Kristie Cowman, MD   LOS: 0 days   Brief narrative:  Samari Gorby is a 66 y.o. male with past medical history of prostate cancer, BPH hypertension [facility] with fever and chills for 24 hours after prostate biopsy on April 18, 2021 by Dr. Lovena Neighbours.  Patient did have a Foley catheter.  In the ED, patient had low blood pressure with tachycardia and febrile to 100.8 Fahrenheit with leukocytosis and acute kidney injury.  Patient was started on broad-spectrum antibiotic and then was admitted hospital for further evaluation and treatment.    Assessment/Plan:  Principal Problem:   Sepsis ([place]) Active Problems:   Malignant neoplasm of prostate ([place])   ARF (acute renal failure) ([place])   Acute lower UTI  Sepsis likely secondary to recent prostate biopsy.  Urology board.  Spoke with on-call urology.  Plan for voiding trial today as per Dr. Gilford Rile.  Continue with IV antibiotic.  E. coli UTI.   From urine culture on 04/22/2021.  Continue with Rocephin.    Acute kidney injury.  Creatinine elevated 1.5 patient's) has improved to 1.1 today. On  Foley catheter.  Voiding trial as per urology.  Essential hypertension  Resume amlodipine from home  Chronic anemia   Hemoglobin of 9.2.  Likely at baseline.  Continue to monitor  History of prostate cancer and BPH recent prostate biopsy. Indwelling Foley catheter being followed by Dr. Lovena Neighbours urologist.  Voiding trial today.   DVT prophylaxis: enoxaparin (LOVENOX) injection 40 mg Start: 04/23/21 1000   Code Status: Full code  Family Communication:    Spoke with the patient's wife at bedside.  Status is: Observation  The patient will require care spanning > 2 midnights and should be moved to inpatient because: Sepsis secondary to UTI, IV antibiotic, indwelling catheter,  Consultants: Urology  Procedures: Foley catheter  Anti-infectives:  Rocephin IV  1/22>  Anti-infectives (From admission, onward)    Start     Dose/Rate Route Frequency Ordered Stop   04/23/21 2300  cefTRIAXone (ROCEPHIN) 2 g in sodium chloride 0.9 % 100 mL IVPB        2 g 200 mL/hr over 30 Minutes Intravenous Every 24 hours 04/23/21 0405     04/22/21 2330  cefTRIAXone (ROCEPHIN) 2 g in sodium chloride 0.9 % 100 mL IVPB        2 g 200 mL/hr over 30 Minutes Intravenous  Once 04/22/21 2315 04/23/21 0017       Subjective Today, patient was seen and examined at bedside.  Patient had low-grade fever but denies pain,  chills or rigor.  Patient's wife at bedside.  Objective: Vitals:   04/24/21 0030 04/24/21 0607  BP:  121/80  Pulse:  78  Resp:  18  Temp: 99.1 F (37.3 C) 99.5 F (37.5 C)  SpO2:  100%    Intake/Output Summary (Last 24 hours) at 04/24/2021 1114 Last data filed at 04/24/2021 1000 Gross per 24 hour  Intake 3851.76 ml  Output 4250 ml  Net -398.24 ml    Filed Weights   04/22/21 2237  Weight: 77.1 kg   Body mass index is 25.1 kg/m.   Physical Exam:  General:  Average built, not in obvious distress HENT:   No scleral pallor or icterus noted. Oral mucosa is moist.  Chest:  Clear breath sounds.  Diminished breath sounds bilaterally. No crackles or wheezes.  CVS: S1 &S2 heard. No murmur.  Regular rate and rhythm.  Abdomen: Soft, nontender, nondistended.  Bowel sounds are heard.  Indwelling Foley catheter in place. Extremities: No cyanosis, clubbing or edema.  Peripheral pulses are palpable. Psych: Alert, awake and oriented, normal mood CNS:  No cranial nerve deficits.  Power equal in all extremities.   Skin: Warm and dry.  No rashes noted.   Data Review: I have personally reviewed the following laboratory data and studies,  CBC: Recent Labs  Lab 04/18/21 1210 04/19/21 0219 04/22/21 2316 04/23/21 0450 04/24/21 0400  WBC  --   --  15.5* 14.5* 8.5  NEUTROABS  --   --   --  12.1*  --   HGB 11.0* 9.7* 9.5* 9.0* 9.2*  HCT 36.6* 31.7*  31.2* 29.1* 30.1*  MCV  --   --  72.6* 71.7* 71.8*  PLT  --   --  296 270 825    Basic Metabolic Panel: Recent Labs  Lab 04/18/21 1210 04/19/21 0219 04/22/21 2316 04/23/21 0450 04/24/21 0400  NA 137 134* 133* 132* 133*  K 3.6 4.2 3.9 3.5 3.7  CL 107 103 101 102 105  CO2 24 22 24 24 24   GLUCOSE 174* 149* 144* 107* 118*  BUN 12 16 24* 23 12  CREATININE 1.08 1.12 2.14* 1.52* 1.10  CALCIUM 8.0* 8.6* 8.5* 8.0* 8.0*  MG  --   --   --   --  2.0    Liver Function Tests: Recent Labs  Lab 04/23/21 0450  AST 17  ALT 14  ALKPHOS 57  BILITOT 0.9  PROT 5.8*  ALBUMIN 3.0*    No results for input(s): LIPASE, AMYLASE in the last 168 hours. No results for input(s): AMMONIA in the last 168 hours. Cardiac Enzymes: No results for input(s): CKTOTAL, CKMB, CKMBINDEX, TROPONINI in the last 168 hours. BNP (last 3 results) No results for input(s): BNP in the last 8760 hours.  ProBNP (last 3 results) No results for input(s): PROBNP in the last 8760 hours.  CBG: No results for input(s): GLUCAP in the last 168 hours. Recent Results (from the past 240 hour(s))  Resp Panel by RT-PCR (Flu A&B, Covid) Nasopharyngeal Swab     Status: None   Collection Time: 04/22/21 11:16 PM   Specimen: Nasopharyngeal Swab; Nasopharyngeal(NP) swabs in vial transport medium  Result Value Ref Range Status   SARS Coronavirus 2 by RT PCR NEGATIVE NEGATIVE Final    Comment: (NOTE) SARS-CoV-2 target nucleic acids are NOT DETECTED.  The SARS-CoV-2 RNA is generally detectable in upper respiratory specimens during the acute phase of infection. The lowest concentration of SARS-CoV-2 viral copies this assay can detect is 138 copies/mL. A negative result does not preclude SARS-Cov-2 infection and should not be used as the sole basis for treatment or other patient management decisions. A negative result may occur with  improper specimen collection/handling, submission of specimen other than nasopharyngeal swab,  presence of viral mutation(s) within the areas targeted by this assay, and inadequate number of viral copies(<138 copies/mL). A negative result must be combined with clinical observations, patient history, and epidemiological information. The expected result is Negative.  Fact Sheet for Patients:  EntrepreneurPulse.com.au  Fact Sheet for Healthcare Providers:  IncredibleEmployment.be  This test is no t yet approved or cleared by the [place] FDA and  has been authorized for detection and/or diagnosis of SARS-CoV-2 by FDA under an Emergency Use Authorization (EUA). This EUA will remain  in effect (meaning this test can be used) for the duration of the COVID-19 declaration under Section  564(b)(1) of the Act, 21 U.S.C.section 360bbb-3(b)(1), unless the authorization is terminated  or revoked sooner.       Influenza A by PCR NEGATIVE NEGATIVE Final   Influenza B by PCR NEGATIVE NEGATIVE Final    Comment: (NOTE) The Xpert Xpress SARS-CoV-2/FLU/RSV plus assay is intended as an aid in the diagnosis of influenza from Nasopharyngeal swab specimens and should not be used as a sole basis for treatment. Nasal washings and aspirates are unacceptable for Xpert Xpress SARS-CoV-2/FLU/RSV testing.  Fact Sheet for Patients: EntrepreneurPulse.com.au  Fact Sheet for Healthcare Providers: IncredibleEmployment.be  This test is not yet approved or cleared by the [place] FDA and has been authorized for detection and/or diagnosis of SARS-CoV-2 by FDA under an Emergency Use Authorization (EUA). This EUA will remain in effect (meaning this test can be used) for the duration of the COVID-19 declaration under Section 564(b)(1) of the Act, 21 U.S.C. section 360bbb-3(b)(1), unless the authorization is terminated or revoked.  Performed at [facility], [place] [address], [place], [place] [address]   Urine  Culture     Status: Abnormal (Preliminary result)   Collection Time: 04/22/21 11:16 PM   Specimen: Urine, Clean Catch  Result Value Ref Range Status   Specimen Description   Final    URINE, CLEAN CATCH Performed at [facility], [place] [address] [place], [place] [address]    Special Requests   Final    NONE Performed at [facility], [place] [address], [place], [place] [address]    Culture (A)  Final    10,000 COLONIES/mL ESCHERICHIA COLI SUSCEPTIBILITIES TO FOLLOW Performed at [facility], [place] [address], [place], [place] [address]    Report Status PENDING  Incomplete  Culture, blood (routine x 2)     Status: None (Preliminary result)   Collection Time: 04/23/21  2:25 AM   Specimen: BLOOD  Result Value Ref Range Status   Specimen Description   Final    BLOOD RIGHT ANTECUBITAL Performed at [place] [address], [place], [place] [address]    Special Requests   Final    BOTTLES DRAWN AEROBIC AND ANAEROBIC Blood Culture adequate volume Performed at [place] [address] [place], [place] [address]    Culture   Final    NO GROWTH 1 DAY Performed at [facility], [place] [address] [place], [place] [address]    Report Status PENDING  Incomplete  Culture, blood (routine x 2)     Status: None (Preliminary result)   Collection Time: 04/23/21  2:35 AM   Specimen: BLOOD  Result Value Ref Range Status   Specimen Description   Final    BLOOD BLOOD LEFT FOREARM Performed at [place] [address], [place], [place] [address]    Special Requests   Final    BOTTLES DRAWN AEROBIC AND ANAEROBIC Blood Culture adequate volume Performed at [place] [address] [place], [place] [address]    Culture   Final    NO GROWTH 1 DAY Performed at [facility], [place] [address] [place], [place] [address]    Report Status PENDING  Incomplete       Studies: No results found.    Flora Lipps, MD  [facility] 04/24/2021  If 7PM-7AM, please contact night-coverage

## 2021-04-25 LAB — URINE CULTURE: Culture: 10000 — AB

## 2021-04-25 MED ORDER — DOCUSATE SODIUM 100 MG PO CAPS: 100.0000 mg | ORAL_CAPSULE | Freq: Every day | ORAL | 0 refills | Status: AC

## 2021-04-25 MED ORDER — SULFAMETHOXAZOLE-TRIMETHOPRIM 800-160 MG PO TABS: 1.0000 | ORAL_TABLET | Freq: Two times a day (BID) | ORAL | 0 refills | Status: AC

## 2021-04-25 MED ORDER — POLYETHYLENE GLYCOL 3350 17 G PO PACK: 17.0000 g | PACK | Freq: Every day | ORAL | 0 refills | Status: AC | PRN

## 2021-04-25 NOTE — Plan of Care (Signed)
°  Problem: Education: Goal: Knowledge of General Education information will improve Description: Including pain rating scale, medication(s)/side effects and non-pharmacologic comfort measures Outcome: Adequate for Discharge   Problem: Health Behavior/Discharge Planning: Goal: Ability to manage health-related needs will improve Outcome: Adequate for Discharge   Problem: Clinical Measurements: Goal: Ability to maintain clinical measurements within normal limits will improve Outcome: Adequate for Discharge Goal: Will remain free from infection Outcome: Adequate for Discharge Goal: Diagnostic test results will improve Outcome: Adequate for Discharge Goal: Respiratory complications will improve 04/25/2021 1324 by Jerene Pitch, RN Outcome: Adequate for Discharge 04/25/2021 1319 by Jerene Pitch, RN Outcome: Progressing Goal: Cardiovascular complication will be avoided Outcome: Adequate for Discharge   Problem: Activity: Goal: Risk for activity intolerance will decrease 04/25/2021 1324 by Jerene Pitch, RN Outcome: Adequate for Discharge 04/25/2021 1319 by Jerene Pitch, RN Outcome: Progressing   Problem: Nutrition: Goal: Adequate nutrition will be maintained 04/25/2021 1324 by Jerene Pitch, RN Outcome: Adequate for Discharge 04/25/2021 1319 by Jerene Pitch, RN Outcome: Progressing   Problem: Coping: Goal: Level of anxiety will decrease Outcome: Adequate for Discharge   Problem: Elimination: Goal: Will not experience complications related to bowel motility Outcome: Adequate for Discharge Goal: Will not experience complications related to urinary retention Outcome: Adequate for Discharge   Problem: Pain Managment: Goal: General experience of comfort will improve Outcome: Adequate for Discharge   Problem: Safety: Goal: Ability to remain free from injury will improve Outcome: Adequate for Discharge   Problem: Skin Integrity: Goal: Risk for impaired skin  integrity will decrease Outcome: Adequate for Discharge

## 2021-04-25 NOTE — Plan of Care (Signed)
  Problem: Clinical Measurements: Goal: Respiratory complications will improve Outcome: Progressing   Problem: Activity: Goal: Risk for activity intolerance will decrease Outcome: Progressing   Problem: Nutrition: Goal: Adequate nutrition will be maintained Outcome: Progressing   

## 2021-04-25 NOTE — Progress Notes (Signed)
Provided and discussed discharge instructions. Addressed all questions and concerns. IV removed intact.  ?Acel Natzke N Ascencion Coye ? ?

## 2021-04-25 NOTE — Discharge Summary (Signed)
Physician Discharge Summary  Patrick Everett KDX:[id number] DOB: 05/22/1955 DOA: 04/22/2021  PCP: Kristie Cowman, MD  Admit date: 04/22/2021 Discharge date: 04/25/2021  Admitted From: Home  Discharge disposition: Home.  Recommendations for Outpatient Follow-Up:   Follow up with your primary care provider in one week.  Check CBC, BMP, magnesium in the next visit Follow-up with urology as outpatient.   Discharge Diagnosis:   Principal Problem:   Sepsis ([place]) Active Problems:   Malignant neoplasm of prostate ([place])   ARF (acute renal failure) ([place])   Acute lower UTI   UTI (urinary tract infection)   Discharge Condition: Improved.  Diet recommendation: Low sodium, heart healthy.    Wound care: None.  Code status: Full.   History of Present Illness:   Patrick Everett is a 66 y.o. male with past medical history of prostate cancer, BPH hypertension [facility] with fever and chills for 24 hours after prostate biopsy on April 18, 2021 by Dr. Lovena Neighbours.  Patient did have a Foley catheter.  In the ED, patient had low blood pressure with tachycardia and febrile to 100.8 Fahrenheit with leukocytosis and acute kidney injury.  Patient was started on broad-spectrum antibiotic and then was admitted hospital for further evaluation and treatment.    Hospital Course:   Following conditions were addressed during hospitalization as listed below,  Sepsis likely secondary to UTI with recent prostate biopsy.  Urology saw the patient during hospitalization.  Recommend 2 weeks of antibiotic in total.  At this time, patient is responding well to treatment.  We will continue Bactrim for 12 more days to complete a 14-day course on discharge.    E. coli UTI.   From urine culture on 04/22/2021.  Sensitive to multiple antibiotic, spoke with pharmacy.  We will continue with Bactrim DS for 12 more days to complete total 14-day course due to good penetration and normal renal function.     Acute kidney  injury.  Has improved at this time.  Has normalized.  Essential hypertension  Resume amlodipine on discharge.   Chronic anemia   Hemoglobin of 9.2.  Likely at baseline.  Continue to monitor   History of prostate cancer and BPH recent prostate biopsy.  Off Foley catheter at this time.  Was able to urinate without Foley catheter.  Disposition.  At this time, patient is stable for disposition home with outpatient PCP and urology.  Medical Consultants:   Urology  Procedures:    Foley catheter placement and removal Subjective:   Today, patient was seen and examined at bedside.  Feels better.  Denies any fever chills or rigor.  Denies any nausea vomiting  Discharge Exam:   Vitals:   04/25/21 0602 04/25/21 1041  BP: 122/84 107/76  Pulse: 70   Resp: 17   Temp: 98.4 F (36.9 C)   SpO2: 100%    Vitals:   04/24/21 2152 04/25/21 0600 04/25/21 0602 04/25/21 1041  BP: 130/90  122/84 107/76  Pulse: 77 71 70   Resp: 18  17   Temp: 98.3 F (36.8 C)  98.4 F (36.9 C)   TempSrc: Oral  Oral   SpO2: 100% 100% 100%   Weight:      Height:       General: Alert awake, not in obvious distress HENT: pupils equally reacting to light,  No scleral pallor or icterus noted. Oral mucosa is moist.  Chest:  Clear breath sounds.  Diminished breath sounds bilaterally. No crackles or wheezes.  CVS: S1 &S2  heard. No murmur.  Regular rate and rhythm. Abdomen: Soft, nontender, nondistended.  Bowel sounds are heard.   Extremities: No cyanosis, clubbing or edema.  Peripheral pulses are palpable. Psych: Alert, awake and oriented, normal mood CNS:  No cranial nerve deficits.  Power equal in all extremities.   Skin: Warm and dry.  No rashes noted.  The results of significant diagnostics from this hospitalization (including imaging, microbiology, ancillary and laboratory) are listed below for reference.     Diagnostic Studies:   No results found.   Labs:   Basic Metabolic Panel: Recent Labs   Lab 04/18/21 1210 04/19/21 0219 04/22/21 2316 04/23/21 0450 04/24/21 0400  NA 137 134* 133* 132* 133*  K 3.6 4.2 3.9 3.5 3.7  CL 107 103 101 102 105  CO2 24 22 24 24 24   GLUCOSE 174* 149* 144* 107* 118*  BUN 12 16 24* 23 12  CREATININE 1.08 1.12 2.14* 1.52* 1.10  CALCIUM 8.0* 8.6* 8.5* 8.0* 8.0*  MG  --   --   --   --  2.0   GFR Estimated Creatinine Clearance: 66.1 mL/min (by C-G formula based on SCr of 1.1 mg/dL). Liver Function Tests: Recent Labs  Lab 04/23/21 0450  AST 17  ALT 14  ALKPHOS 57  BILITOT 0.9  PROT 5.8*  ALBUMIN 3.0*   No results for input(s): LIPASE, AMYLASE in the last 168 hours. No results for input(s): AMMONIA in the last 168 hours. Coagulation profile No results for input(s): INR, PROTIME in the last 168 hours.  CBC: Recent Labs  Lab 04/18/21 1210 04/19/21 0219 04/22/21 2316 04/23/21 0450 04/24/21 0400  WBC  --   --  15.5* 14.5* 8.5  NEUTROABS  --   --   --  12.1*  --   HGB 11.0* 9.7* 9.5* 9.0* 9.2*  HCT 36.6* 31.7* 31.2* 29.1* 30.1*  MCV  --   --  72.6* 71.7* 71.8*  PLT  --   --  296 270 272   Cardiac Enzymes: No results for input(s): CKTOTAL, CKMB, CKMBINDEX, TROPONINI in the last 168 hours. BNP: Invalid input(s): POCBNP CBG: No results for input(s): GLUCAP in the last 168 hours. D-Dimer No results for input(s): DDIMER in the last 72 hours. Hgb A1c No results for input(s): HGBA1C in the last 72 hours. Lipid Profile No results for input(s): CHOL, HDL, LDLCALC, TRIG, CHOLHDL, LDLDIRECT in the last 72 hours. Thyroid function studies No results for input(s): TSH, T4TOTAL, T3FREE, THYROIDAB in the last 72 hours.  Invalid input(s): FREET3 Anemia work up No results for input(s): VITAMINB12, FOLATE, FERRITIN, TIBC, IRON, RETICCTPCT in the last 72 hours. Microbiology Recent Results (from the past 240 hour(s))  Resp Panel by RT-PCR (Flu A&B, Covid) Nasopharyngeal Swab     Status: None   Collection Time: 04/22/21 11:16 PM   Specimen:  Nasopharyngeal Swab; Nasopharyngeal(NP) swabs in vial transport medium  Result Value Ref Range Status   SARS Coronavirus 2 by RT PCR NEGATIVE NEGATIVE Final    Comment: (NOTE) SARS-CoV-2 target nucleic acids are NOT DETECTED.  The SARS-CoV-2 RNA is generally detectable in upper respiratory specimens during the acute phase of infection. The lowest concentration of SARS-CoV-2 viral copies this assay can detect is 138 copies/mL. A negative result does not preclude SARS-Cov-2 infection and should not be used as the sole basis for treatment or other patient management decisions. A negative result may occur with  improper specimen collection/handling, submission of specimen other than nasopharyngeal swab, presence of viral mutation(s)  within the areas targeted by this assay, and inadequate number of viral copies(<138 copies/mL). A negative result must be combined with clinical observations, patient history, and epidemiological information. The expected result is Negative.  Fact Sheet for Patients:  EntrepreneurPulse.com.au  Fact Sheet for Healthcare Providers:  IncredibleEmployment.be  This test is no t yet approved or cleared by the [place] FDA and  has been authorized for detection and/or diagnosis of SARS-CoV-2 by FDA under an Emergency Use Authorization (EUA). This EUA will remain  in effect (meaning this test can be used) for the duration of the COVID-19 declaration under Section 564(b)(1) of the Act, 21 U.S.C.section 360bbb-3(b)(1), unless the authorization is terminated  or revoked sooner.       Influenza A by PCR NEGATIVE NEGATIVE Final   Influenza B by PCR NEGATIVE NEGATIVE Final    Comment: (NOTE) The Xpert Xpress SARS-CoV-2/FLU/RSV plus assay is intended as an aid in the diagnosis of influenza from Nasopharyngeal swab specimens and should not be used as a sole basis for treatment. Nasal washings and aspirates are unacceptable for  Xpert Xpress SARS-CoV-2/FLU/RSV testing.  Fact Sheet for Patients: EntrepreneurPulse.com.au  Fact Sheet for Healthcare Providers: IncredibleEmployment.be  This test is not yet approved or cleared by the [place] FDA and has been authorized for detection and/or diagnosis of SARS-CoV-2 by FDA under an Emergency Use Authorization (EUA). This EUA will remain in effect (meaning this test can be used) for the duration of the COVID-19 declaration under Section 564(b)(1) of the Act, 21 U.S.C. section 360bbb-3(b)(1), unless the authorization is terminated or revoked.  Performed at [facility], [place] [address], [place], [place] [address]   Urine Culture     Status: Abnormal   Collection Time: 04/22/21 11:16 PM   Specimen: Urine, Clean Catch  Result Value Ref Range Status   Specimen Description   Final    URINE, CLEAN CATCH Performed at [facility], [place] [address] [place], [place] [address]    Special Requests   Final    NONE Performed at [facility], [place] [address] [place], [place] [address]    Culture 10,000 COLONIES/mL ESCHERICHIA COLI (A)  Final   Report Status 04/25/2021 FINAL  Final   Organism ID, Bacteria ESCHERICHIA COLI (A)  Final      Susceptibility   Escherichia coli - MIC*    AMPICILLIN <=2 SENSITIVE Sensitive     CEFAZOLIN <=4 SENSITIVE Sensitive     CEFEPIME <=0.12 SENSITIVE Sensitive     CEFTRIAXONE <=0.25 SENSITIVE Sensitive     CIPROFLOXACIN >=4 RESISTANT Resistant     GENTAMICIN <=1 SENSITIVE Sensitive     IMIPENEM <=0.25 SENSITIVE Sensitive     NITROFURANTOIN <=16 SENSITIVE Sensitive     TRIMETH/SULFA <=20 SENSITIVE Sensitive     AMPICILLIN/SULBACTAM <=2 SENSITIVE Sensitive     PIP/TAZO <=4 SENSITIVE Sensitive     * 10,000 COLONIES/mL ESCHERICHIA COLI  Culture, blood (routine x 2)     Status: None (Preliminary result)   Collection Time: 04/23/21  2:25 AM    Specimen: BLOOD  Result Value Ref Range Status   Specimen Description   Final    BLOOD RIGHT ANTECUBITAL Performed at [place] [address] [place], [place] [address]    Special Requests   Final    BOTTLES DRAWN AEROBIC AND ANAEROBIC Blood Culture adequate volume Performed at [place] [address], [place],  [address]    Culture  Final    NO GROWTH 2 DAYS Performed at [facility], [place] [address], [place], [place] [address]    Report Status PENDING  Incomplete  Culture, blood (routine x 2)     Status: None (Preliminary result)   Collection Time: 04/23/21  2:35 AM   Specimen: BLOOD  Result Value Ref Range Status   Specimen Description   Final    BLOOD BLOOD LEFT FOREARM Performed at [facility] [address], [place], [place] [address]    Special Requests   Final    BOTTLES DRAWN AEROBIC AND ANAEROBIC Blood Culture adequate volume Performed at [place] [address], [place], [place] [address]    Culture   Final    NO GROWTH 2 DAYS Performed at [facility] [address], [place], [place] [address]    Report Status PENDING  Incomplete     Discharge Instructions:   Discharge Instructions     Call MD for:  persistant nausea and vomiting   Complete by: As directed    Call MD for:  severe uncontrolled pain   Complete by: As directed    Call MD for:  temperature >100.4   Complete by: As directed    Diet - low sodium heart healthy   Complete by: As directed    Discharge instructions   Complete by: As directed    Follow-up with your primary care physician in 1 week.  Follow-up with urology as scheduled by the clinic.  Seek medical attention for worsening symptoms.  Complete the course of antibiotic   Increase activity slowly   Complete by: As directed       Allergies as of 04/25/2021   No Known Allergies      Medication List     STOP taking  these medications    ciprofloxacin 500 MG tablet Commonly known as: CIPRO   oxybutynin 5 MG tablet Commonly known as: DITROPAN       TAKE these medications    amLODipine 10 MG tablet Commonly known as: NORVASC Take 10 mg by mouth daily.   docusate sodium 100 MG capsule Commonly known as: COLACE Take 1 capsule (100 mg total) by mouth daily.   phenazopyridine 200 MG tablet Commonly known as: Pyridium Take 1 tablet (200 mg total) by mouth 3 (three) times daily as needed (for pain with urination).   polyethylene glycol 17 g packet Commonly known as: MIRALAX / GLYCOLAX Take 17 g by mouth daily as needed for moderate constipation.   sulfamethoxazole-trimethoprim 800-160 MG tablet Commonly known as: BACTRIM DS Take 1 tablet by mouth 2 (two) times daily for 12 days.   traMADol 50 MG tablet Commonly known as: Ultram Take 1 tablet (50 mg total) by mouth every 6 (six) hours as needed for up to 3 days.   Vitamin D (Ergocalciferol) 1.25 MG (50000 UNIT) Caps capsule Commonly known as: DRISDOL Take 50,000 Units by mouth every 7 (seven) days. Saturday        Follow-up Information     Kristie Cowman, MD Follow up.   Specialty: Family Medicine Contact information: [place] [address] [phone]         Ceasar Mons, MD Follow up.   Specialty: Urology Contact information: [place] [address] [place] [address] [phone]                  Time coordinating discharge: 39 minutes  Signed:  Ladamien Rammel  [facility] 04/25/2021, 11:51 AM

## 2021-04-25 NOTE — Progress Notes (Signed)
Patient voiding w/o difficulty and denies dysuria or hematuria.  Benign prostate biopsy results discussed.  Plan for f/u in 4-6 weeks in my office for a check-up.

## 2021-04-28 LAB — CULTURE, BLOOD (ROUTINE X 2)
Culture: NO GROWTH
Culture: NO GROWTH
Special Requests: ADEQUATE
Special Requests: ADEQUATE

## 2021-06-08 IMAGING — CT CT ABD-PELV W/O CM
2 of 4 series · 16 of 46 positions shown, 18 images · non-contrast
Comparison: None.

CLINICAL DATA: Diarrhea for 2 days, nausea and vomiting, emesis,
pain

EXAM:
CT ABDOMEN AND PELVIS WITHOUT CONTRAST
TECHNIQUE: Multidetector CT imaging of the abdomen and pelvis was performed
following the standard protocol without IV contrast.

[Series 2: axial st · axial · 0.82mm/px · z∈[-530,-100]mm · 13 of 94 slices shown, 15 images]
[im 4/94  soft-tissue]
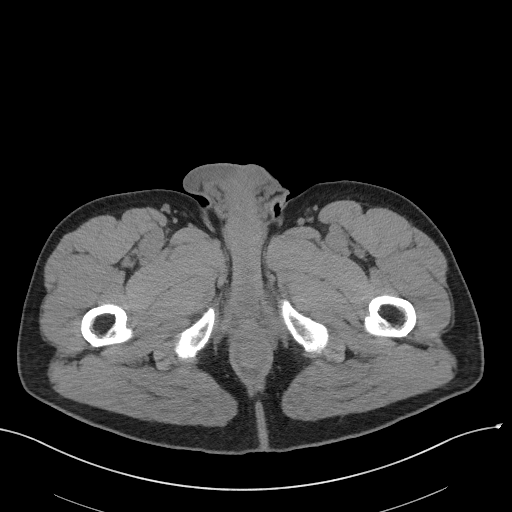
[im 4/94  bone]
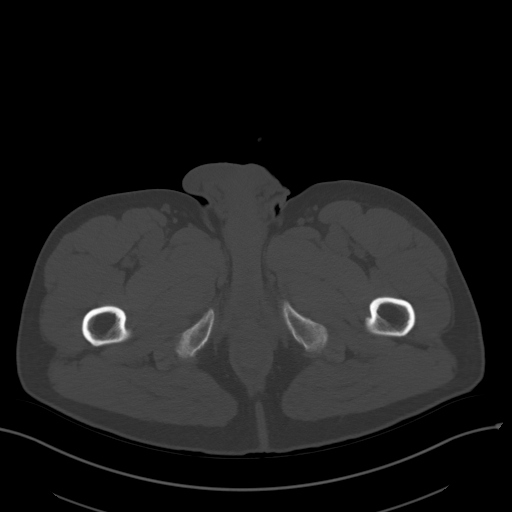
[im 11/94  soft-tissue]
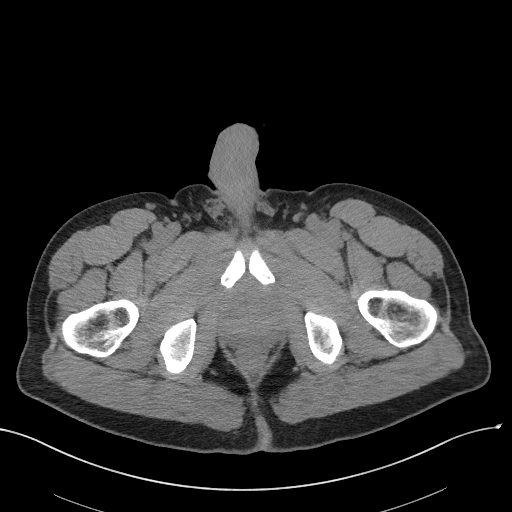
[im 18/94  soft-tissue]
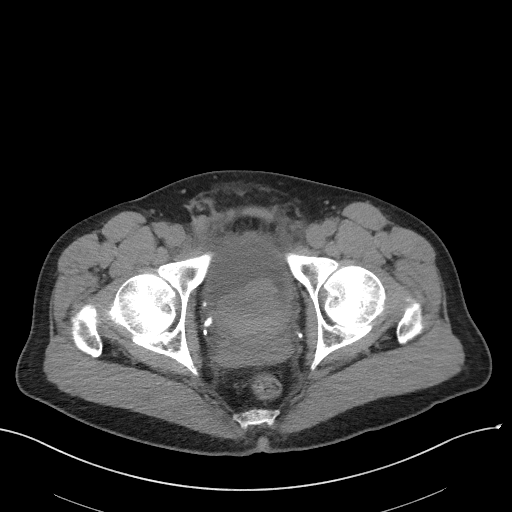
[im 26/94  soft-tissue]
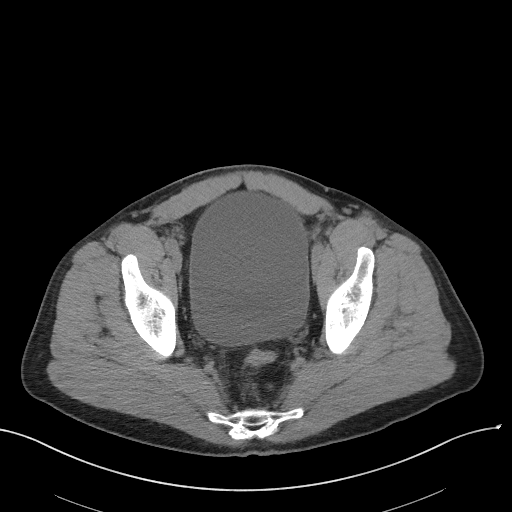
[im 33/94  soft-tissue]
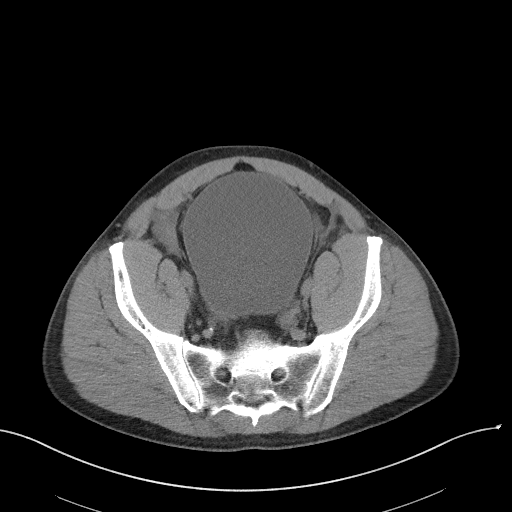
[im 40/94  soft-tissue]
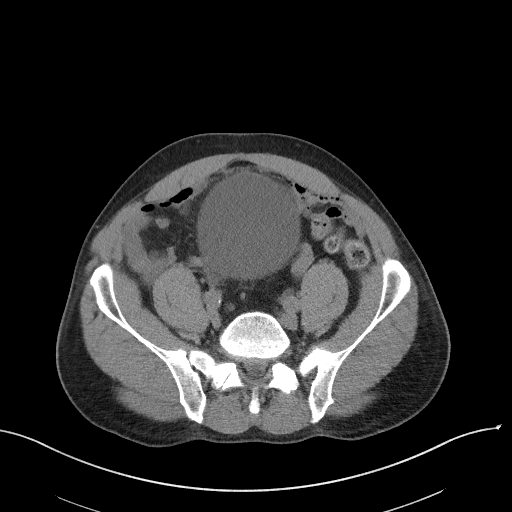
[im 47/94  soft-tissue]
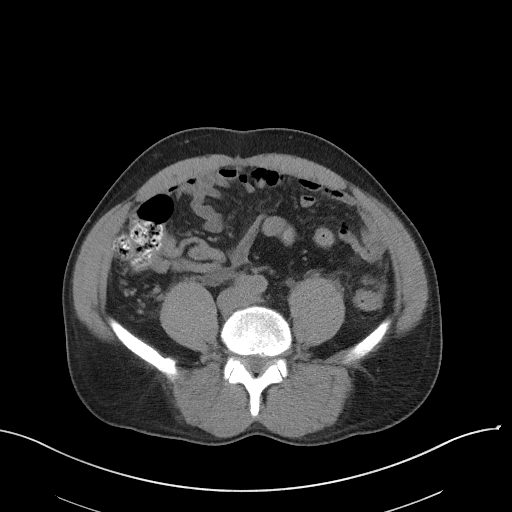
[im 54/94  soft-tissue]
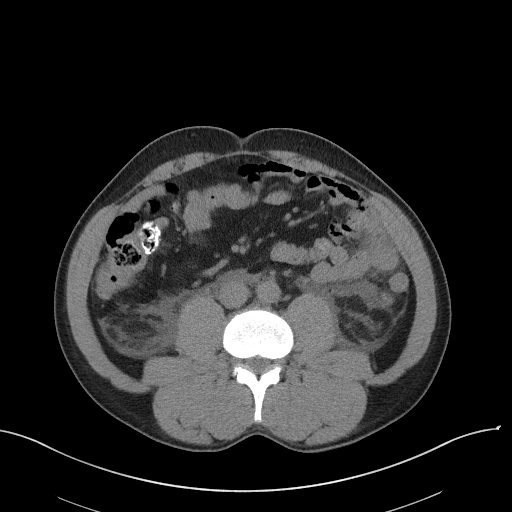
[im 61/94  soft-tissue]
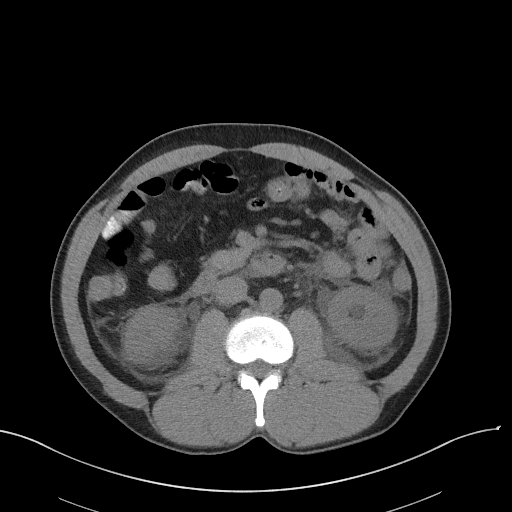
[im 61/94  bone]
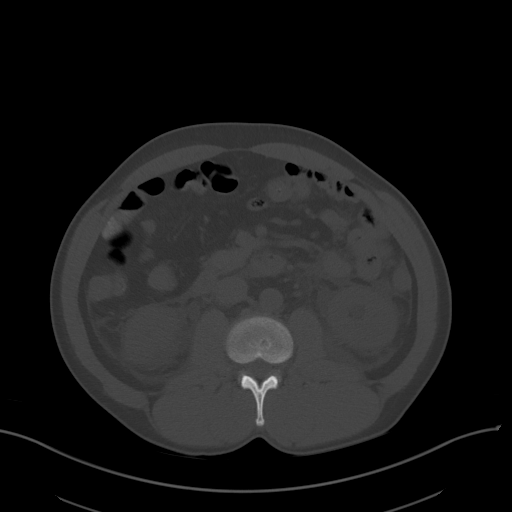
[im 68/94  soft-tissue]
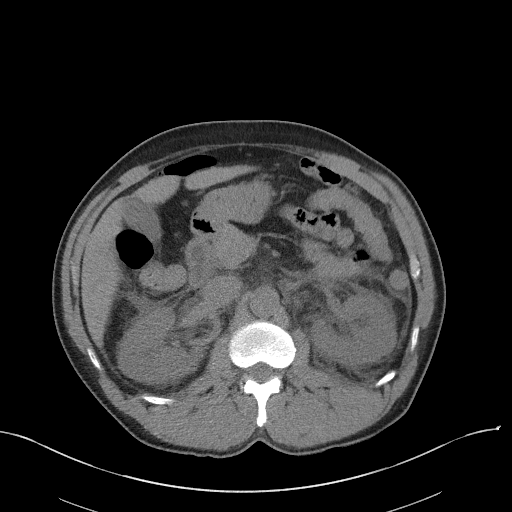
[im 76/94  soft-tissue]
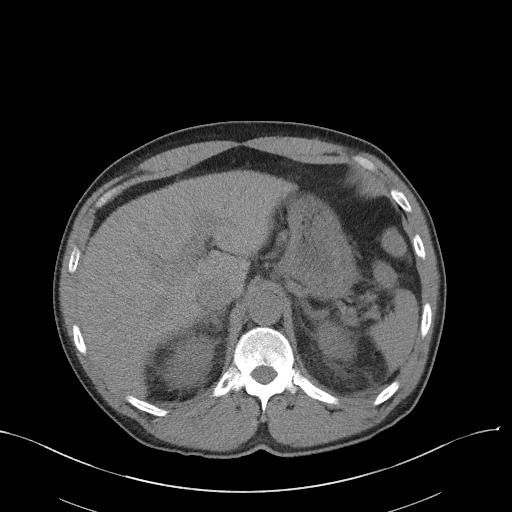
[im 83/94  soft-tissue]
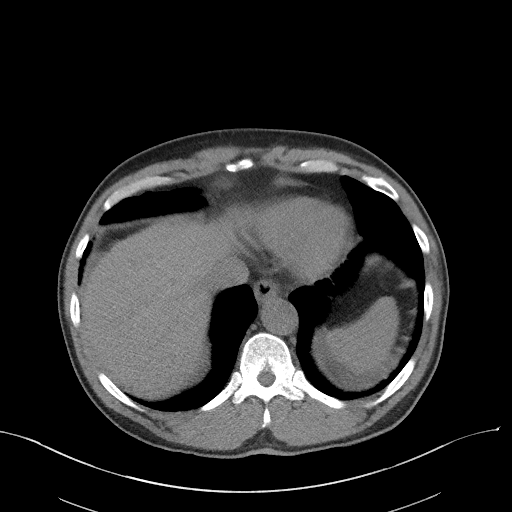
[im 90/94  soft-tissue]
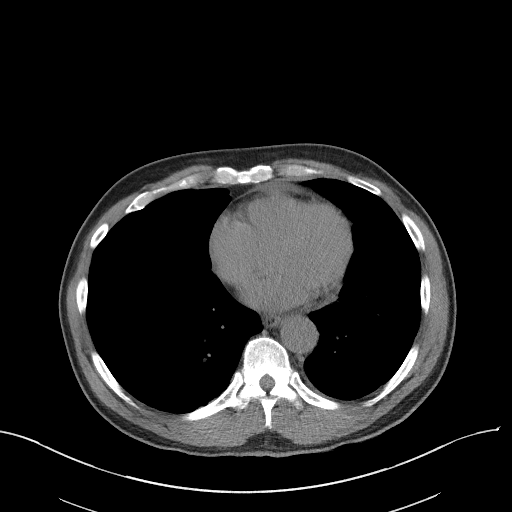

[Series 5: coronal st · coronal · 0.70mm/px · 3 of 101 slices shown]
[im 34/101  soft-tissue]
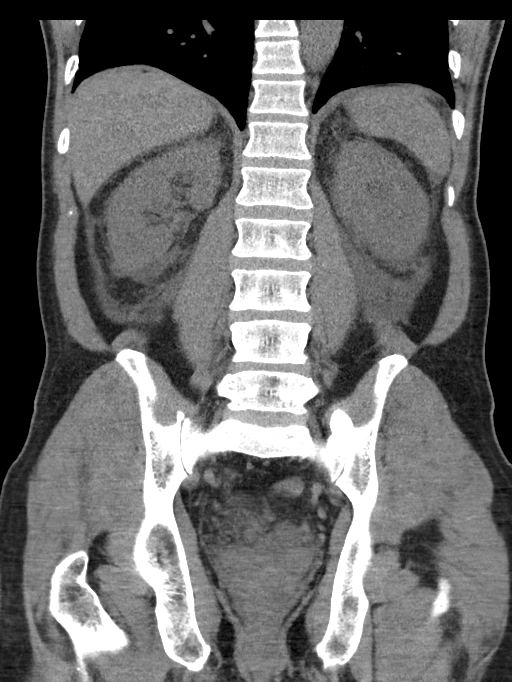
[im 45/101  soft-tissue]
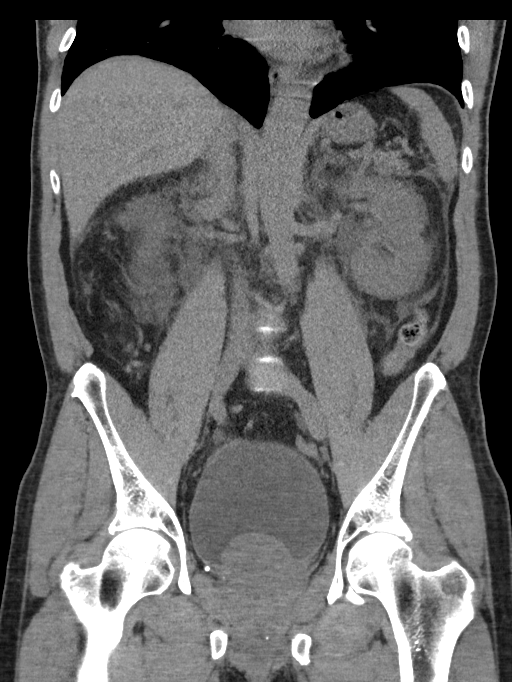
[im 56/101  soft-tissue]
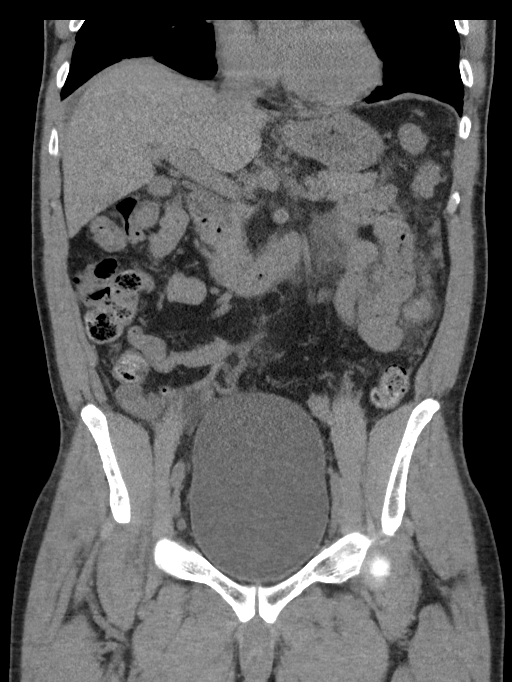

[16 of 46 positions shown; findings below may reference images not displayed]

FINDINGS: Lower chest: No acute pleural or parenchymal lung disease.

Hepatobiliary: Unenhanced imaging of the liver demonstrates no gross
abnormalities. No biliary duct dilation. The gallbladder is
unremarkable.

Pancreas: Unremarkable. No pancreatic ductal dilatation or
surrounding inflammatory changes.

Spleen: Normal in size without focal abnormality.

Adrenals/Urinary Tract: No evidence of urinary tract calculi. There
is mild distension of the bilateral renal collecting systems and
ureters, likely due to significant bladder distension. Bladder
distension is likely due to chronic bladder outlet obstruction given
a markedly enlarged and heterogeneous prostate. Significant
bilateral perirenal fat stranding also felt to be the sequela of
chronic bladder outlet obstruction. The adrenals are grossly normal.

Stomach/Bowel: No bowel obstruction or ileus. Normal appendix right
lower quadrant. No bowel wall thickening or inflammatory change.

Vascular/Lymphatic: No significant vascular findings on this
unenhanced exam. No pathologic adenopathy within the abdomen or
pelvis.

Reproductive: The prostate is heterogeneous and enlarged, measuring
approximately 6.3 x 7.4 x 6.3 cm.

Other: There is trace free fluid within the pelvis. No free
intraperitoneal gas. No abdominal wall hernia.

Musculoskeletal: No acute or destructive bony lesions. Reconstructed
images demonstrate no additional findings.
IMPRESSION: 1. Markedly enlarged prostate, with evidence of chronic bladder
outlet obstruction.
2. Trace pelvic free fluid, nonspecific.
3. No evidence of bowel obstruction or ileus. No CT findings to
explain the patient's diarrhea and nausea/vomiting.

## 2021-06-09 IMAGING — US US RENAL
1 series · 14 of 25 positions shown · non-contrast
Comparison: 08/27/2020 CT abdomen pelvis

CLINICAL DATA: Acute kidney injury

EXAM:
RENAL / URINARY TRACT ULTRASOUND COMPLETE

[Series 1: us renal · 14 of 26 slices shown]
[im 1/26]
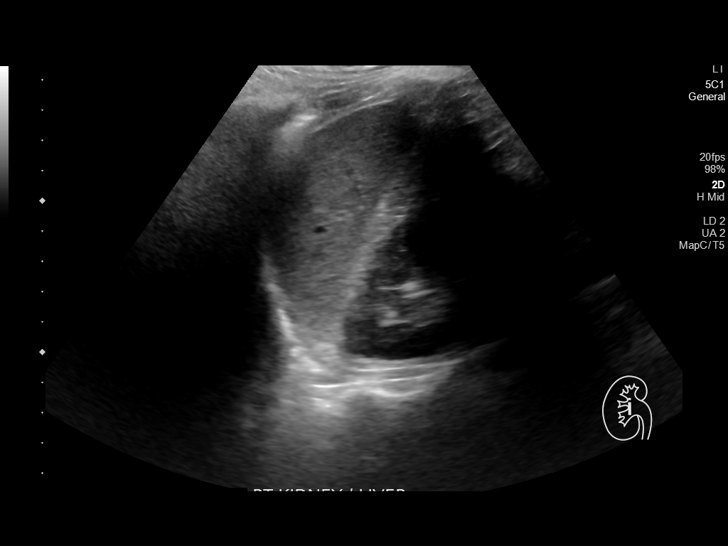
[im 3/26]
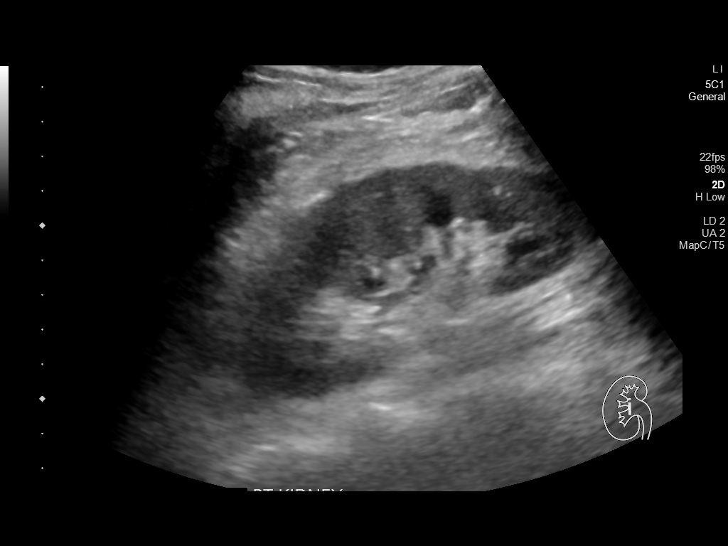
[im 5/26]
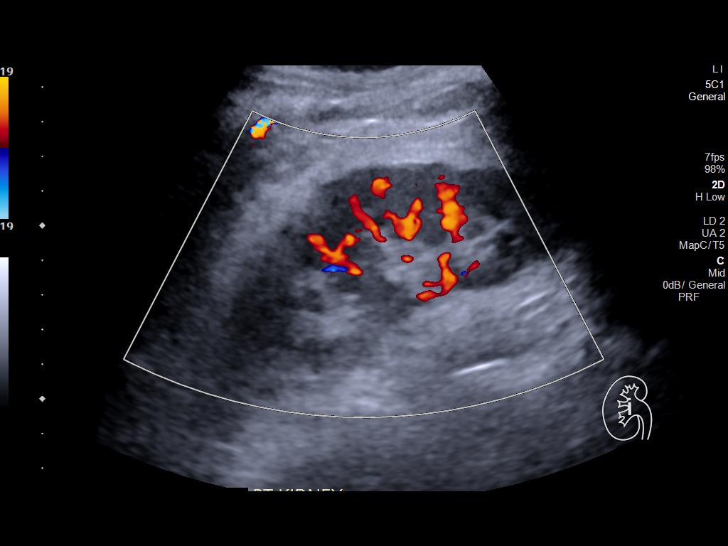
[im 7/26]
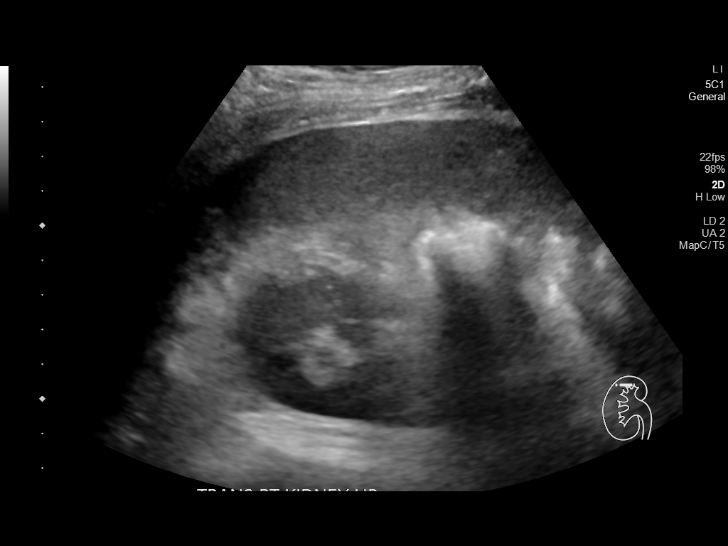
[im 9/26]
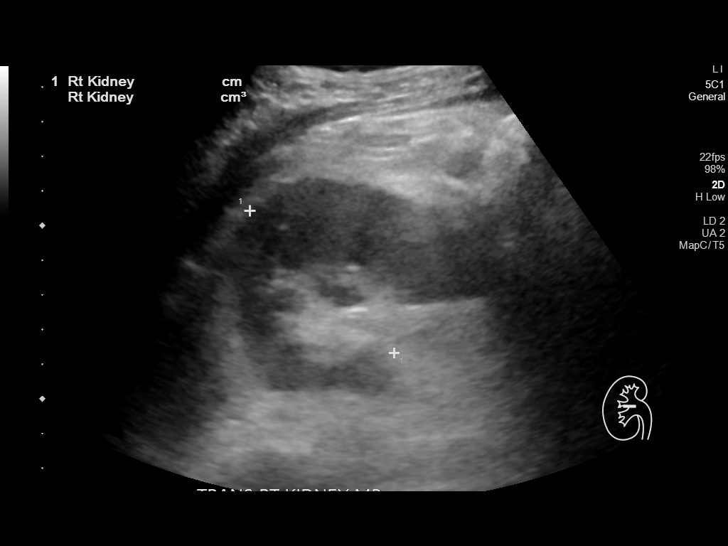
[im 10/26]
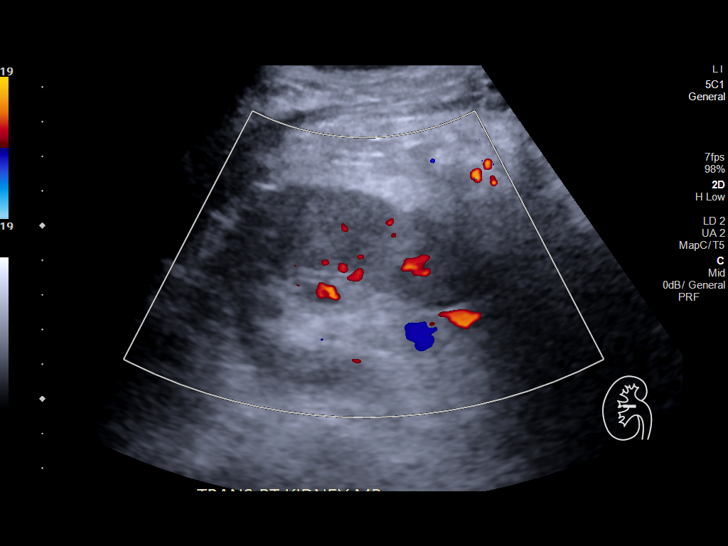
[im 12/26]
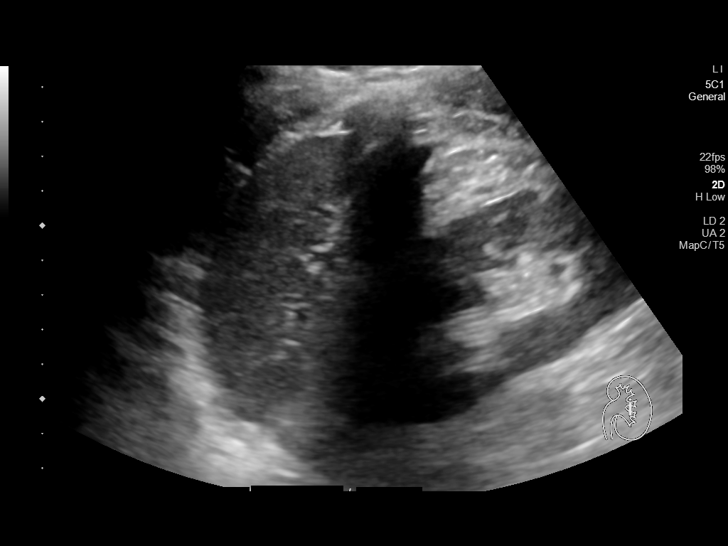
[im 14/26]
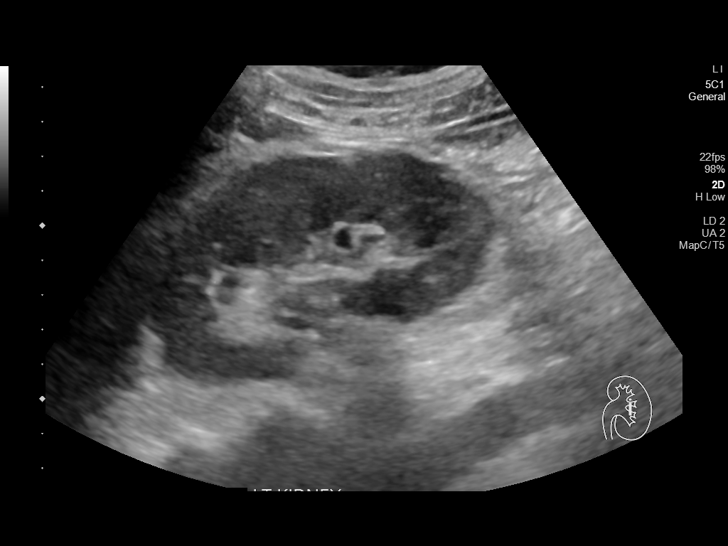
[im 16/26]
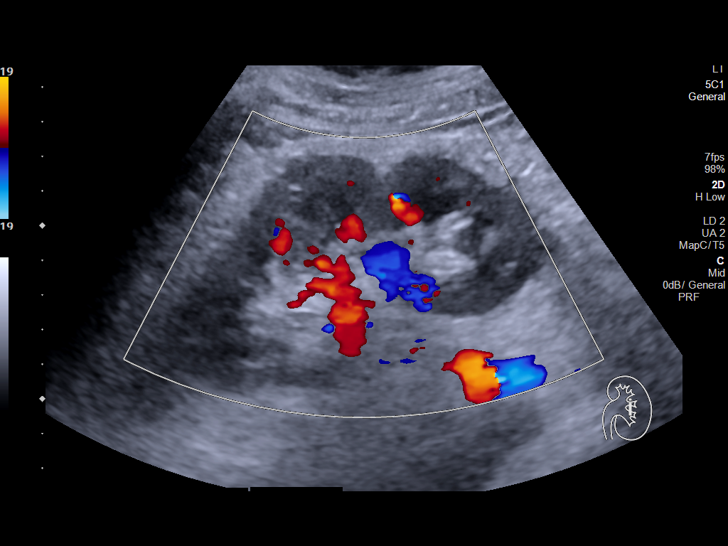
[im 17/26]
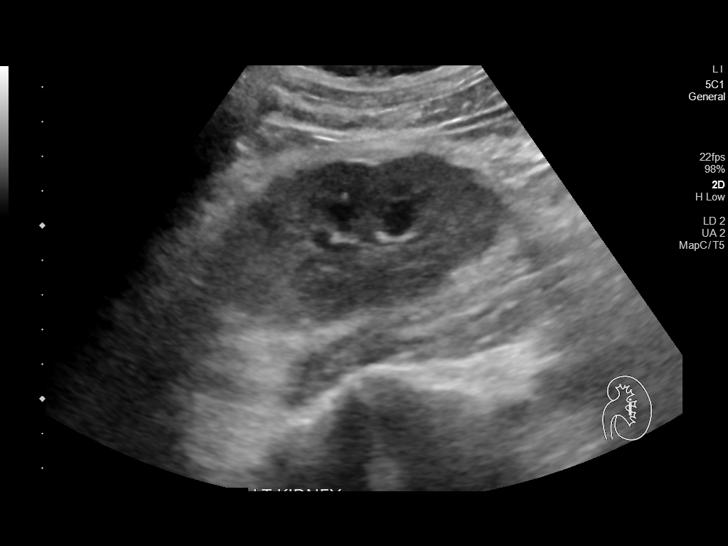
[im 19/26]
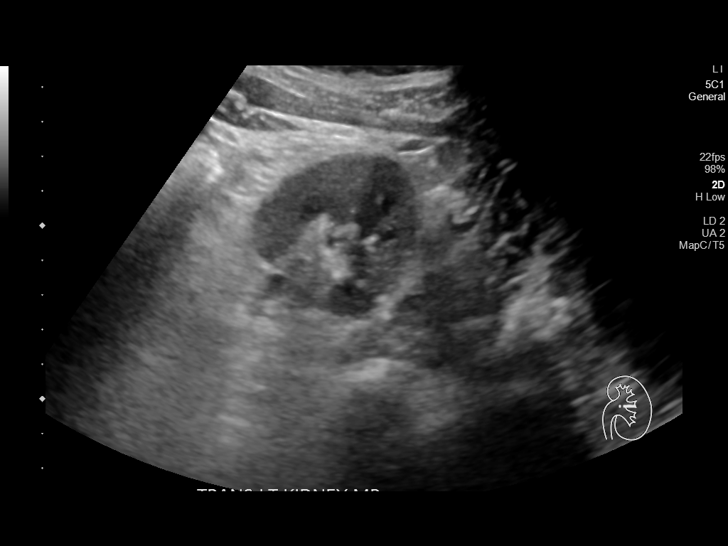
[im 21/26]
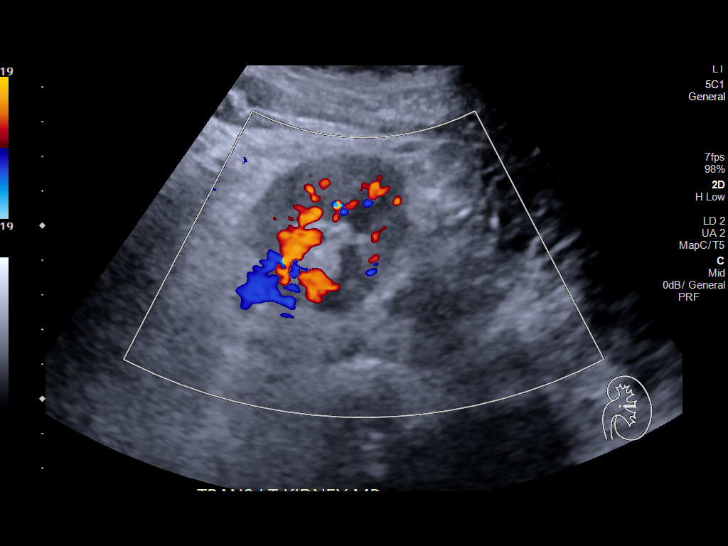
[im 23/26]
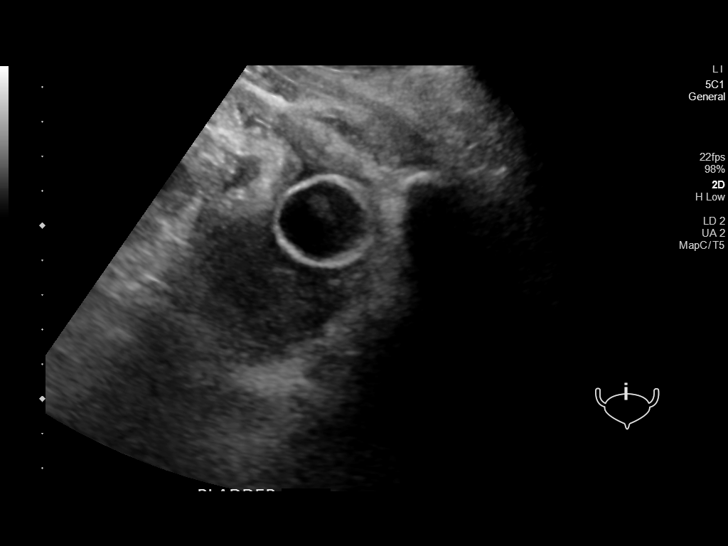
[im 26/26]
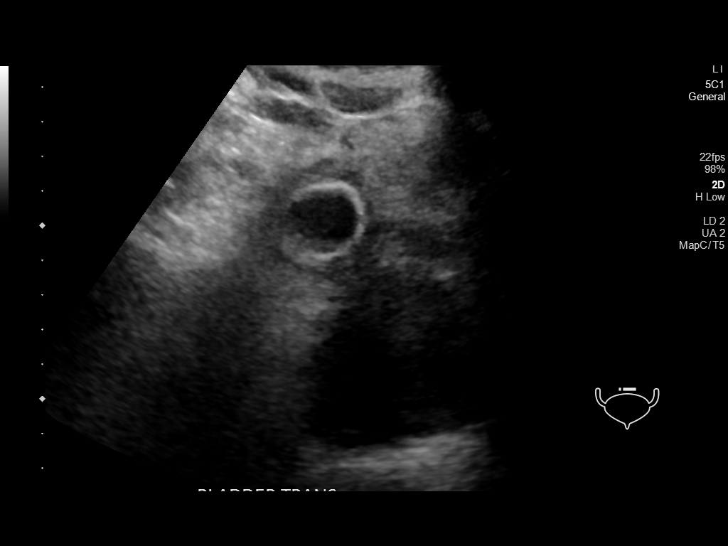

[14 of 25 positions shown; findings below may reference images not displayed]

FINDINGS: Right Kidney:

Renal measurements: 12.1 x 5.5 x 5.9 cm = volume: 204.4 mL.
Echogenicity within normal limits. No mass or hydronephrosis
visualized.

Left Kidney:

Renal measurements: 11.3 x 5.3 x 5.4 cm = volume: 170.4 mL.
Echogenicity within normal limits. No mass or hydronephrosis
visualized.

Bladder:

Appears normal for degree of bladder distention. Small amount of
debris in the bladder. Foley catheter in the bladder.

Other:

None.
IMPRESSION: 1. Normal renal ultrasound.

## 2021-06-25 IMAGING — DX DG CHEST 1V PORT
1 series · 1 of 1 positions shown · non-contrast
Comparison: None.

CLINICAL DATA: Possible sepsis

EXAM:
PORTABLE CHEST 1 VIEW

[chest ap]
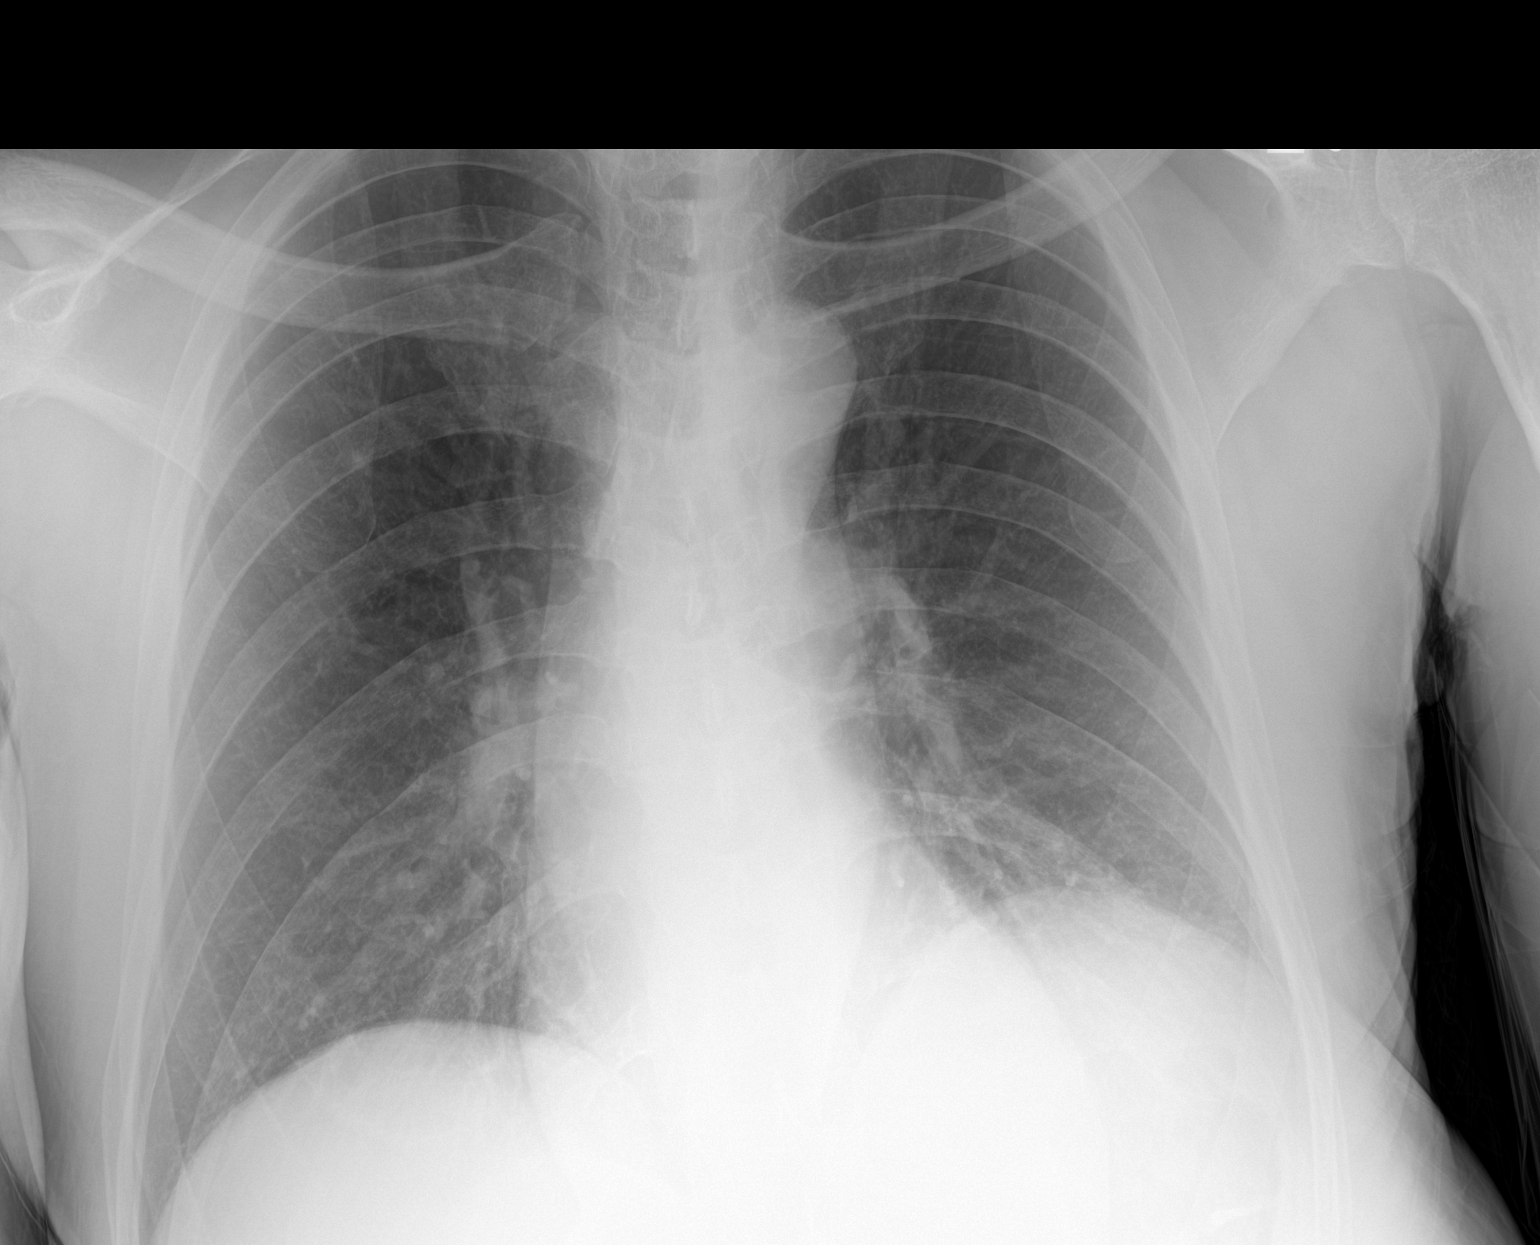

[1 of 1 positions shown; findings below may reference images not displayed]

FINDINGS: Right lung is grossly clear. Streaky and hazy atelectasis left base.
No consolidation or effusion. Normal cardiomediastinal silhouette.
No pneumothorax
IMPRESSION: Streaky and hazy atelectasis at the left base

## 2021-08-17 ENCOUNTER — Other Ambulatory Visit: Payer: Self-pay

## 2021-08-17 ENCOUNTER — Encounter (HOSPITAL_BASED_OUTPATIENT_CLINIC_OR_DEPARTMENT_OTHER): Payer: Self-pay | Admitting: Emergency Medicine

## 2021-08-17 ENCOUNTER — Emergency Department (HOSPITAL_BASED_OUTPATIENT_CLINIC_OR_DEPARTMENT_OTHER)
Admission: EM | Admit: 2021-08-17 | Discharge: 2021-08-17 | Disposition: A | Discharge status: Home / Self Care | Payer: BC Managed Care – PPO | Attending: Emergency Medicine | Admitting: Emergency Medicine

## 2021-08-17 DIAGNOSIS — B079 Viral wart, unspecified: Secondary | ICD-10-CM

## 2021-08-17 NOTE — Discharge Instructions (Signed)
You have a wart in the left hand.  You will likely need to see your dermatologist and get it treated  Return to ER if you have uncontrolled bleeding, severe pain

## 2021-08-17 NOTE — ED Provider Notes (Signed)
[facility] Provider Note   CSN: [id number] Arrival date & time: 08/17/21  1716     History  Chief Complaint  Patient presents with   Hand Problem    Patrick Everett is a 66 y.o. male here presenting with left hand wart.  Patient noticed a wart in the left hand for several months.  He saw his doctor and was referred to dermatology.  His appointment is in July.  He was cutting hedges yesterday and he put peroxide on it and then it started swelling up and bleeding.  Patient is not on blood thinners  The history is provided by the patient.      Home Medications Prior to Admission medications   Medication Sig Start Date End Date Taking? Authorizing Provider  amLODipine (NORVASC) 10 MG tablet Take 10 mg by mouth daily. 06/07/20   [provider]  phenazopyridine (PYRIDIUM) 200 MG tablet Take 1 tablet (200 mg total) by mouth 3 (three) times daily as needed (for pain with urination). Patient not taking: Reported on 04/23/2021 04/19/21 04/19/22  Ceasar Mons, MD  polyethylene glycol (MIRALAX / GLYCOLAX) 17 g packet Take 17 g by mouth daily as needed for moderate constipation. 04/25/21   Pokhrel, Corrie Mckusick, MD  traMADol (ULTRAM) 50 MG tablet Take 1 tablet (50 mg total) by mouth every 6 (six) hours as needed for up to 3 days. Patient not taking: Reported on 04/23/2021 04/19/21 04/23/22  Ceasar Mons, MD  Vitamin D, Ergocalciferol, (DRISDOL) 1.25 MG (50000 UNIT) CAPS capsule Take 50,000 Units by mouth every 7 (seven) days. Saturday    [provider]      Allergies    Patient has no known allergies.    Review of Systems   Review of Systems  Skin:  Positive for wound.  All other systems reviewed and are negative.  Physical Exam Updated Vital Signs BP (!) 136/97 (BP Location: Right Arm)   Pulse 70   Temp 98.5 F (36.9 C) (Oral)   Resp 18   Ht '5\' 9"'$  (1.753 m)   Wt 77.1 kg   SpO2 99%   BMI 25.10 kg/m  Physical  Exam Vitals and nursing note reviewed.  HENT:     Head: Normocephalic.     Nose: Nose normal.     Mouth/Throat:     Mouth: Mucous membranes are moist.  Eyes:     Pupils: Pupils are equal, round, and reactive to light.  Musculoskeletal:     Cervical back: Normal range of motion.     Comments: Left palm with small 1 cm wart with small pedunculated growth.  No active bleeding.  Neurovascular intact in the left hand  Skin:    Capillary Refill: Capillary refill takes less than 2 seconds.  Neurological:     General: No focal deficit present.     Mental Status: He is alert and oriented to person, place, and time.  Psychiatric:        Mood and Affect: Mood normal.        Behavior: Behavior normal.    ED Results / Procedures / Treatments   Labs (all labs ordered are listed, but only abnormal results are displayed) Labs Reviewed - No data to display  EKG None  Radiology No results found.  Procedures Procedures    Medications Ordered in ED Medications - No data to display  ED Course/ Medical Decision Making/ A&P  Medical Decision Making Patrick Everett is a 66 y.o. male here presenting with left hand wart.  Patient had a wart and he put peroxide on it and started bleeding and swelled up.  Patient already has dermatology appointment.  I told him that this usually requires incision and sent for cytology and also multiple treatments.  He follows up with [facility].  I told patient to call PCP to get an earlier appointment.  Nurse to wrap it with compression dressing to keep the swelling down.   Problems Addressed: Wart of hand: acute illness or injury    Final Clinical Impression(s) / ED Diagnoses Final diagnoses:  None    Rx / DC Orders ED Discharge Orders     None         Drenda Freeze, MD 08/17/21 1757

## 2021-08-17 NOTE — ED Triage Notes (Signed)
Had a bump on lrft palm and was cutting hedges yesterday and rubbed the top off the bump , has seen a dr for the bump states may be a wart

## 2022-01-24 ENCOUNTER — Other Ambulatory Visit: Payer: Self-pay | Admitting: Urology

## 2022-01-24 DIAGNOSIS — C61 Malignant neoplasm of prostate: Secondary | ICD-10-CM

## 2022-03-07 ENCOUNTER — Ambulatory Visit
Admission: RE | Admit: 2022-03-07 | Discharge: 2022-03-07 | Disposition: A | Discharge status: Home / Self Care | Payer: BC Managed Care – PPO | Source: Ambulatory Visit | Attending: Urology | Admitting: Urology

## 2022-03-07 DIAGNOSIS — C61 Malignant neoplasm of prostate: Secondary | ICD-10-CM

## 2022-03-07 MED ORDER — GADOPICLENOL 0.5 MMOL/ML IV SOLN
7.0000 mL | Freq: Once | INTRAVENOUS | Status: AC | PRN
  Administered 2022-03-07: 7 mL via INTRAVENOUS

## 2022-03-23 ENCOUNTER — Emergency Department (HOSPITAL_BASED_OUTPATIENT_CLINIC_OR_DEPARTMENT_OTHER): Payer: BC Managed Care – PPO

## 2022-03-23 ENCOUNTER — Other Ambulatory Visit: Payer: Self-pay

## 2022-03-23 ENCOUNTER — Encounter (HOSPITAL_BASED_OUTPATIENT_CLINIC_OR_DEPARTMENT_OTHER): Payer: Self-pay

## 2022-03-23 DIAGNOSIS — R066 Hiccough: Secondary | ICD-10-CM | POA: Insufficient documentation

## 2022-03-23 DIAGNOSIS — J029 Acute pharyngitis, unspecified: Secondary | ICD-10-CM | POA: Diagnosis present

## 2022-03-23 DIAGNOSIS — Z20822 Contact with and (suspected) exposure to covid-19: Secondary | ICD-10-CM | POA: Insufficient documentation

## 2022-03-23 DIAGNOSIS — J101 Influenza due to other identified influenza virus with other respiratory manifestations: Secondary | ICD-10-CM | POA: Diagnosis not present

## 2022-03-23 LAB — RESP PANEL BY RT-PCR (RSV, FLU A&B, COVID)  RVPGX2
Influenza A by PCR: POSITIVE — AB
Influenza B by PCR: NEGATIVE
Resp Syncytial Virus by PCR: NEGATIVE
SARS Coronavirus 2 by RT PCR: NEGATIVE

## 2022-03-23 LAB — GROUP A STREP BY PCR: Group A Strep by PCR: NOT DETECTED

## 2022-03-23 NOTE — ED Triage Notes (Addendum)
Pt endorses hiccup that will not go away. Pt has had URI symptoms in past few days with cough. Pt states he had 4 days of hiccupping. Pt also reports sore throat.

## 2022-03-24 ENCOUNTER — Emergency Department (HOSPITAL_BASED_OUTPATIENT_CLINIC_OR_DEPARTMENT_OTHER)
Admission: EM | Admit: 2022-03-24 | Discharge: 2022-03-24 | Disposition: A | Discharge status: Home / Self Care | Payer: BC Managed Care – PPO | Attending: Emergency Medicine | Admitting: Emergency Medicine

## 2022-03-24 DIAGNOSIS — J101 Influenza due to other identified influenza virus with other respiratory manifestations: Secondary | ICD-10-CM

## 2022-03-24 DIAGNOSIS — R066 Hiccough: Secondary | ICD-10-CM

## 2022-03-24 MED ORDER — GUAIFENESIN-DM 100-10 MG/5ML PO SYRP
5.0000 mL | ORAL_SOLUTION | Freq: Once | ORAL | Status: AC
  Administered 2022-03-24: 5 mL via ORAL
  Filled 2022-03-24: qty 5

## 2022-03-24 MED ORDER — PROMETHAZINE-DM 6.25-15 MG/5ML PO SYRP: 5.0000 mL | ORAL_SOLUTION | Freq: Four times a day (QID) | ORAL | 0 refills | Status: AC | PRN

## 2022-03-24 NOTE — ED Provider Notes (Signed)
[facility]  Provider Note  CSN: [id number] Arrival date & time: 03/23/22 2218  History Chief Complaint  Patient presents with   Hiccups   Sore Throat    Patrick Everett is a 66 y.o. male here with wife who was sick about 10 days ago. Patient has had persistent cough and hiccups for about 4 days. No fevers. Some sore throat.    Home Medications Prior to Admission medications   Medication Sig Start Date End Date Taking? Authorizing Provider  promethazine-dextromethorphan (PROMETHAZINE-DM) 6.25-15 MG/5ML syrup Take 5 mLs by mouth 4 (four) times daily as needed for cough. 03/24/22  Yes Truddie Hidden, MD  amLODipine (NORVASC) 10 MG tablet Take 10 mg by mouth daily. 06/07/20   [provider]  phenazopyridine (PYRIDIUM) 200 MG tablet Take 1 tablet (200 mg total) by mouth 3 (three) times daily as needed (for pain with urination). Patient not taking: Reported on 04/23/2021 04/19/21 04/19/22  Ceasar Mons, MD  polyethylene glycol (MIRALAX / GLYCOLAX) 17 g packet Take 17 g by mouth daily as needed for moderate constipation. 04/25/21   Pokhrel, Corrie Mckusick, MD  traMADol (ULTRAM) 50 MG tablet Take 1 tablet (50 mg total) by mouth every 6 (six) hours as needed for up to 3 days. Patient not taking: Reported on 04/23/2021 04/19/21 04/23/22  Ceasar Mons, MD  Vitamin D, Ergocalciferol, (DRISDOL) 1.25 MG (50000 UNIT) CAPS capsule Take 50,000 Units by mouth every 7 (seven) days. Saturday    [provider]     Allergies    Patient has no known allergies.   Review of Systems   Review of Systems Please see HPI for pertinent positives and negatives  Physical Exam BP 132/88 (BP Location: Left Arm)   Pulse 80   Temp 99.7 F (37.6 C) (Oral)   Resp 18   Ht '5\' 9"'$  (1.753 m)   Wt 78.9 kg   SpO2 100%   BMI 25.70 kg/m   Physical Exam Vitals and nursing note reviewed.  Constitutional:      Appearance: Normal appearance.  HENT:      Head: Normocephalic and atraumatic.     Nose: Nose normal.     Mouth/Throat:     Mouth: Mucous membranes are moist.  Eyes:     Extraocular Movements: Extraocular movements intact.     Conjunctiva/sclera: Conjunctivae normal.  Cardiovascular:     Rate and Rhythm: Normal rate.  Pulmonary:     Effort: Pulmonary effort is normal.     Breath sounds: Normal breath sounds.  Abdominal:     General: Abdomen is flat.     Palpations: Abdomen is soft.     Tenderness: There is no abdominal tenderness.  Musculoskeletal:        General: No swelling. Normal range of motion.     Cervical back: Neck supple.  Skin:    General: Skin is warm and dry.  Neurological:     General: No focal deficit present.     Mental Status: He is alert.  Psychiatric:        Mood and Affect: Mood normal.     ED Results / Procedures / Treatments   EKG None  Procedures Procedures  Medications Ordered in the ED Medications  guaiFENesin-dextromethorphan (ROBITUSSIN DM) 100-10 MG/5ML syrup 5 mL (has no administration in time range)    Initial Impression and Plan  Patient here with URI and hiccups. Flu test is positive as a cause of his symptoms. He reports  hiccups are only bad when he's been coughing but not as bad in between. I personally viewed the images from radiology studies and agree with radiologist interpretation: CXR is clear. Recommend supportive care at home. Will Rx phenergan-DM cough syrup to help with symptoms. Recommend isolation until feeling better,   ED Course       MDM Rules/Calculators/A&P Medical Decision Making Problems Addressed: Hiccups: acute illness or injury Influenza A: acute illness or injury  Amount and/or Complexity of Data Reviewed Labs: ordered. Decision-making details documented in ED Course. Radiology: ordered and independent interpretation performed. Decision-making details documented in ED Course.  Risk OTC drugs. Prescription drug management.    Final Clinical  Impression(s) / ED Diagnoses Final diagnoses:  Influenza A  Hiccups    Rx / DC Orders ED Discharge Orders          Ordered    promethazine-dextromethorphan (PROMETHAZINE-DM) 6.25-15 MG/5ML syrup  4 times daily PRN        03/24/22 0155             Truddie Hidden, MD 03/24/22 [phone]

## 2023-01-23 ENCOUNTER — Other Ambulatory Visit: Payer: Self-pay | Admitting: Urology

## 2023-01-23 DIAGNOSIS — C61 Malignant neoplasm of prostate: Secondary | ICD-10-CM

## 2023-04-05 ENCOUNTER — Ambulatory Visit
Admission: RE | Admit: 2023-04-05 | Discharge: 2023-04-05 | Disposition: A | Discharge status: Home / Self Care | Payer: BC Managed Care – PPO | Source: Ambulatory Visit | Attending: Urology

## 2023-04-05 DIAGNOSIS — C61 Malignant neoplasm of prostate: Secondary | ICD-10-CM

## 2023-04-05 MED ORDER — GADOPICLENOL 0.5 MMOL/ML IV SOLN
7.0000 mL | Freq: Once | INTRAVENOUS | Status: AC | PRN
  Administered 2023-04-05: 7 mL via INTRAVENOUS

## 2024-02-12 ENCOUNTER — Other Ambulatory Visit: Payer: Self-pay | Admitting: Urology

## 2024-02-12 DIAGNOSIS — C61 Malignant neoplasm of prostate: Secondary | ICD-10-CM

## 2024-03-19 ENCOUNTER — Encounter: Payer: Self-pay | Admitting: Urology
